# Patient Record
Sex: Female | Born: 1965 | Race: White | Hispanic: No | Marital: Married | Smoking: Never smoker
Health system: Southern US, Community
[De-identification: ages and names within clinical notes are randomized; demographics above are authoritative.]

## PROBLEM LIST (undated history)

## (undated) DIAGNOSIS — R519 Headache, unspecified: Secondary | ICD-10-CM

## (undated) DIAGNOSIS — F909 Attention-deficit hyperactivity disorder, unspecified type: Secondary | ICD-10-CM

## (undated) DIAGNOSIS — K219 Gastro-esophageal reflux disease without esophagitis: Secondary | ICD-10-CM

## (undated) DIAGNOSIS — F329 Major depressive disorder, single episode, unspecified: Secondary | ICD-10-CM

## (undated) DIAGNOSIS — R51 Headache: Secondary | ICD-10-CM

## (undated) DIAGNOSIS — I10 Essential (primary) hypertension: Secondary | ICD-10-CM

## (undated) DIAGNOSIS — Z8719 Personal history of other diseases of the digestive system: Secondary | ICD-10-CM

## (undated) DIAGNOSIS — F32A Depression, unspecified: Secondary | ICD-10-CM

## (undated) DIAGNOSIS — N200 Calculus of kidney: Secondary | ICD-10-CM

## (undated) DIAGNOSIS — D649 Anemia, unspecified: Secondary | ICD-10-CM

## (undated) DIAGNOSIS — Z87442 Personal history of urinary calculi: Secondary | ICD-10-CM

## (undated) DIAGNOSIS — G4730 Sleep apnea, unspecified: Secondary | ICD-10-CM

## (undated) HISTORY — DX: Gastro-esophageal reflux disease without esophagitis: K21.9

## (undated) HISTORY — DX: Essential (primary) hypertension: I10

## (undated) HISTORY — PX: HAND SURGERY: SHX662

---

## 1998-09-11 HISTORY — PX: ABDOMINAL HYSTERECTOMY: SHX81

## 2006-11-24 ENCOUNTER — Emergency Department: Payer: Self-pay | Admitting: Emergency Medicine

## 2006-12-29 ENCOUNTER — Emergency Department: Payer: Self-pay | Admitting: Emergency Medicine

## 2008-01-12 ENCOUNTER — Emergency Department: Payer: Self-pay | Admitting: Internal Medicine

## 2008-04-06 ENCOUNTER — Emergency Department: Payer: Self-pay | Admitting: Emergency Medicine

## 2008-09-24 ENCOUNTER — Emergency Department: Payer: Self-pay | Admitting: Emergency Medicine

## 2011-09-13 ENCOUNTER — Emergency Department: Payer: Self-pay | Admitting: Emergency Medicine

## 2013-05-19 ENCOUNTER — Emergency Department: Payer: Self-pay | Admitting: Emergency Medicine

## 2013-05-19 LAB — URINALYSIS, COMPLETE
Bilirubin,UR: NEGATIVE
Glucose,UR: NEGATIVE mg/dL (ref 0–75)
Ketone: NEGATIVE
Ph: 6 (ref 4.5–8.0)
RBC,UR: 129 /HPF (ref 0–5)
Specific Gravity: 1.02 (ref 1.003–1.030)
WBC UR: 376 /HPF (ref 0–5)

## 2013-11-07 ENCOUNTER — Emergency Department: Payer: Self-pay | Admitting: Emergency Medicine

## 2013-11-07 LAB — CBC
HCT: 38.6 % (ref 35.0–47.0)
HGB: 13.1 g/dL (ref 12.0–16.0)
MCH: 30.7 pg (ref 26.0–34.0)
MCHC: 34 g/dL (ref 32.0–36.0)
MCV: 90 fL (ref 80–100)
PLATELETS: 270 10*3/uL (ref 150–440)
RBC: 4.28 10*6/uL (ref 3.80–5.20)
RDW: 13 % (ref 11.5–14.5)
WBC: 4.3 10*3/uL (ref 3.6–11.0)

## 2013-11-07 LAB — COMPREHENSIVE METABOLIC PANEL
ALBUMIN: 4.3 g/dL (ref 3.4–5.0)
ALK PHOS: 79 U/L
ANION GAP: 3 — AB (ref 7–16)
BUN: 12 mg/dL (ref 7–18)
Bilirubin,Total: 0.3 mg/dL (ref 0.2–1.0)
CO2: 24 mmol/L (ref 21–32)
Calcium, Total: 9 mg/dL (ref 8.5–10.1)
Chloride: 112 mmol/L — ABNORMAL HIGH (ref 98–107)
Creatinine: 0.92 mg/dL (ref 0.60–1.30)
EGFR (Non-African Amer.): >60
Glucose: 87 mg/dL (ref 65–99)
OSMOLALITY: 277 (ref 275–301)
POTASSIUM: 3.5 mmol/L (ref 3.5–5.1)
SGOT(AST): 21 U/L (ref 15–37)
SGPT (ALT): 20 U/L (ref 12–78)
SODIUM: 139 mmol/L (ref 136–145)
Total Protein: 7.6 g/dL (ref 6.4–8.2)

## 2013-11-07 LAB — URINALYSIS, COMPLETE
BILIRUBIN, UR: NEGATIVE
BLOOD: NEGATIVE
Bacteria: NONE SEEN
GLUCOSE, UR: NEGATIVE mg/dL (ref 0–75)
Ketone: NEGATIVE
LEUKOCYTE ESTERASE: NEGATIVE
Nitrite: NEGATIVE
Ph: 6 (ref 4.5–8.0)
Protein: NEGATIVE
Specific Gravity: 1.01 (ref 1.003–1.030)
Transitional Epi: <1

## 2013-11-07 LAB — LIPASE, BLOOD: LIPASE: 137 U/L (ref 73–393)

## 2016-04-07 ENCOUNTER — Other Ambulatory Visit (HOSPITAL_COMMUNITY): Payer: Self-pay | Admitting: General Surgery

## 2016-04-19 ENCOUNTER — Encounter (HOSPITAL_COMMUNITY): Payer: Self-pay | Admitting: Radiology

## 2016-04-19 ENCOUNTER — Ambulatory Visit (HOSPITAL_COMMUNITY)
Admission: RE | Admit: 2016-04-19 | Discharge: 2016-04-19 | Disposition: A | Discharge status: Home / Self Care | Payer: BLUE CROSS/BLUE SHIELD | Source: Ambulatory Visit | Attending: General Surgery | Admitting: General Surgery

## 2016-04-19 ENCOUNTER — Other Ambulatory Visit: Payer: Self-pay

## 2016-04-19 DIAGNOSIS — K449 Diaphragmatic hernia without obstruction or gangrene: Secondary | ICD-10-CM | POA: Insufficient documentation

## 2016-05-01 ENCOUNTER — Encounter: Payer: Self-pay | Admitting: Skilled Nursing Facility1

## 2016-05-01 ENCOUNTER — Encounter: Payer: BLUE CROSS/BLUE SHIELD | Attending: General Surgery | Admitting: Skilled Nursing Facility1

## 2016-05-01 DIAGNOSIS — M545 Low back pain: Secondary | ICD-10-CM | POA: Insufficient documentation

## 2016-05-01 DIAGNOSIS — G43009 Migraine without aura, not intractable, without status migrainosus: Secondary | ICD-10-CM | POA: Insufficient documentation

## 2016-05-01 DIAGNOSIS — R0683 Snoring: Secondary | ICD-10-CM | POA: Insufficient documentation

## 2016-05-01 DIAGNOSIS — E669 Obesity, unspecified: Secondary | ICD-10-CM | POA: Insufficient documentation

## 2016-05-01 DIAGNOSIS — Z713 Dietary counseling and surveillance: Secondary | ICD-10-CM | POA: Insufficient documentation

## 2016-05-01 DIAGNOSIS — K219 Gastro-esophageal reflux disease without esophagitis: Secondary | ICD-10-CM | POA: Insufficient documentation

## 2016-05-01 NOTE — Progress Notes (Signed)
  Pre-Op Assessment Visit:  Pre-Operative Roux-En-Y Surgery  Medical Nutrition Therapy:  Appt start time: 0822  End time:  0900.  Patient was seen on 05/01/2016 for Pre-Operative Nutrition Assessment. Assessment and [facility] coordinator on 05/01/2016.   Preferred Learning Style:   No preference indicated   Learning Readiness:   Ready  Handouts given during visit include:  Pre-Op Goals Bariatric Surgery Protein Shakes  During the appointment today the following Pre-Op Goals were reviewed with the patient: Maintain or lose weight as instructed by your surgeon Make healthy food choices Begin to limit portion sizes Limited concentrated sugars and fried foods Keep fat/sugar in the single digits per serving on   food labels Practice CHEWING your food  (aim for 30 chews per bite or until applesauce consistency) Practice not drinking 15 minutes before, during, and 30 minutes after each meal/snack Avoid all carbonated beverages  Avoid/limit caffeinated beverages  Avoid all sugar-sweetened beverages Consume 3 meals per day; eat every 3-5 hours Make a list of non-food related activities Aim for 64-100 ounces of FLUID daily  Aim for at least 60-80 grams of PROTEIN daily Look for a liquid protein source that contain ?15 g protein and ?5 g carbohydrate  (ex: shakes, drinks, shots)  Patient-Centered Goals: 10/10 specific/non-scale and confidence/importance scale 1-10  Demonstrated degree of understanding via:  Teach Back  Teaching Method Utilized:  Visual Auditory Hands on  Barriers to learning/adherence to lifestyle change: none identified  Patient to call the [facility] to enroll in Pre-Op and Post-Op Nutrition Education when surgery date is scheduled.

## 2016-05-02 ENCOUNTER — Encounter: Payer: Self-pay | Admitting: Skilled Nursing Facility1

## 2016-05-22 ENCOUNTER — Ambulatory Visit (HOSPITAL_BASED_OUTPATIENT_CLINIC_OR_DEPARTMENT_OTHER): Payer: BLUE CROSS/BLUE SHIELD | Attending: General Surgery | Admitting: Internal Medicine

## 2016-05-22 VITALS — Ht 60.0 in | Wt 188.0 lb

## 2016-05-22 DIAGNOSIS — R0683 Snoring: Secondary | ICD-10-CM | POA: Insufficient documentation

## 2016-05-22 DIAGNOSIS — G4733 Obstructive sleep apnea (adult) (pediatric): Secondary | ICD-10-CM | POA: Insufficient documentation

## 2016-05-27 DIAGNOSIS — R0683 Snoring: Secondary | ICD-10-CM

## 2016-05-27 NOTE — Procedures (Signed)
    Patient Name: Kristin Copeland, Kristin Copeland Gender: Female D.O.B: 1966/08/23 Age (years): 50 Referring Provider: Gaynelle AduEric Wilson Height (inches): 60 Interpreting Physician: Jetty Duhamellinton Deadrian Toya MD, ABSM Weight (lbs): 188 RPSGT: Brainard SinkBarksdale, [place] BMI: 37 MRN: [id number] Neck Size: 14.50 CLINICAL INFORMATION Sleep Study Type: unattended HST   Indication for sleep study: Snoring   Epworth Sleepiness Score: 12 SLEEP STUDY TECHNIQUE A multi-channel overnight portable sleep study was performed. The channels recorded were: nasal airflow, thoracic respiratory movement, and oxygen saturation with a pulse oximetry. Snoring was also monitored.  MEDICATIONS Patient self administered medications include: none reported during study.  SLEEP ARCHITECTURE Patient was studied for 453.6 minutes. The sleep efficiency was 96.3 % and the patient was supine for 47.8%. The arousal index was 0.0 per hour.  RESPIRATORY PARAMETERS The overall AHI was 9.5 per hour, with a central apnea index of 0.3 per hour. The oxygen nadir was 82% during sleep.  CARDIAC DATA Mean heart rate during sleep was 70.0 bpm.  IMPRESSIONS - Mild obstructive sleep apnea occurred during this study (AHI = 9.5/h). - No significant central sleep apnea occurred during this study (CAI = 0.3/h). - Moderate oxygen desaturation was noted during this study (Min O2 = 82%). - Patient snored.  DIAGNOSIS - Obstructive Sleep Apnea (327.23 [G47.33 ICD-10])  RECOMMENDATIONS - Therapeutic CPAP titration to determine optimal pressure required to alleviate sleep disordered breathing. - Positional therapy avoiding supine position during sleep. - Surgical consultation for Uvulopalatopharyngoplasty (UPPP) may be considered. - Oral appliance may be considered. - Avoid alcohol, sedatives and other CNS depressants that may worsen sleep apnea and disrupt normal sleep architecture. - Sleep hygiene should be reviewed to assess factors that may  improve sleep quality. - Weight management and regular exercise should be initiated or continued.  [Electronically signed] 05/27/2016 09:35 AM  Jetty Duhamellinton Myshawn Chiriboga MD, ABSM Diplomate, American Board of Sleep Medicine   NPI: [id number]  Waymon BudgeYOUNG,Cauy Melody D Diplomate, American Board of Sleep Medicine  ELECTRONICALLY SIGNED ON:  05/27/2016, 9:35 AM [facility] PH: [phone]   FX: [phone] ACCREDITED BY THE AMERICAN ACADEMY OF SLEEP MEDICINE

## 2016-06-15 NOTE — Progress Notes (Signed)
[facility]      Assessment and Plan:  Obstructive sleep apnea. -Mild obstructive sleep apnea, discussed implications, as well as possible therapies. -We'll send the patient for titration, per insurance requirements. The patient will need to follow up within 1-3 months after initiation of CPAP.  Obesity.  -Patient is being planned for a Roux-en-Y gastric bypass  Date: 06/15/2016  MRN# [id number] Kristin Copeland 08/12/1966   Kristin Copeland is a 50 y.o. old female seen in consultation for chief complaint of:    Chief Complaint  Patient presents with  . sleep consult    Per Dr. Andrey CampanileWilson. last sleep study on 05-22-16. pt c/o daytime sleepiness, restless & gasping for air. EPWORTH:15    HPI:  The patient is a 50 year old female, with a history of ADD, depression, obesity. She is currently being evaluated for a  Roux-en-Y gastric bypass, she recently underwent a sleep study on 05/23/16 which showed mild obstructive sleep apnea.She typically goes to bed between 11 PM and 12 midnight. Usually takes 30 minutes to an hour to fall asleep, she wakes up 3-5 times per night. She typically gets up out of bed at 9 AM. She has gained approximately 20 pounds in the last 2 years. Her home medications include Wellbutrin, topiramate, propranolol, Ritalin.  She notes daytime sleepiness, she snores at night and has been told by husband that she stops breathing in her sleep. He sleeps with earplugs and headphones.   Home sleep Study Date: 05/23/2016 IMPRESSIONS - Mild obstructive sleep apnea occurred during this study (AHI = 9.5/h). - No significant central sleep apnea occurred during this study (CAI = 0.3/h). - Moderate oxygen desaturation was noted during this study (Min O2 = 82%). - Patient snored.   PMHX:   Past Medical History:  Diagnosis Date  . GERD (gastroesophageal reflux disease)   . Hypertension    Surgical Hx:  Past Surgical History:  Procedure Laterality Date    . ABDOMINAL HYSTERECTOMY    . HAND SURGERY     Family Hx:  Family History  Problem Relation Age of Onset  . Diabetes Other   . Hyperlipidemia Other   . Cancer Other   . Hypertension Other    Social Hx:   Social History  Substance Use Topics  . Smoking status: Not on file  . Smokeless tobacco: Not on file  . Alcohol use Not on file   Medication:     Reviewed.  Allergies:  Latex  Review of Systems: Gen:  Denies  fever, sweats, chills HEENT: Denies blurred vision, double vision. bleeds, sore throat Cvc:  No dizziness, chest pain. Resp:   Denies cough or sputum production, shortness of breath Gi: Denies swallowing difficulty, stomach pain. Endoc:  No polyuria, polydipsia. Psych: No depression, insomnia. Other:  All other systems were reviewed with the patient and were negative other that what is mentioned in the HPI.   Physical Examination:   VS: BP 128/86 (BP Location: Left Arm, Cuff Size: Normal)   Pulse 91   Ht 5' (1.524 m)   Wt 192 lb (87.1 kg)   SpO2 97%   BMI 37.50 kg/m   General Appearance: No distress  Neuro:without focal findings,  speech normal,  HEENT: PERRLA, EOM intact.   Pulmonary: normal breath sounds, No wheezing.  CardiovascularNormal S1,S2.  No m/r/g.   Abdomen: Benign, Soft, non-tender. Renal:  No costovertebral tenderness  GU:  No performed at this time. Endoc: No evident thyromegaly, no signs of  acromegaly. Skin:   warm, no rashes, no ecchymosis  Extremities: normal, no cyanosis, clubbing.  Other findings:    LABORATORY PANEL:   CBC No results for input(s): WBC, HGB, HCT, PLT in the last 168 hours. ------------------------------------------------------------------------------------------------------------------  Chemistries  No results for input(s): NA, K, CL, CO2, GLUCOSE, BUN, CREATININE, CALCIUM, MG, AST, ALT, ALKPHOS, BILITOT in the last 168 hours.  Invalid input(s):  GFRCGP ------------------------------------------------------------------------------------------------------------------  Cardiac Enzymes No results for input(s): TROPONINI in the last 168 hours. ------------------------------------------------------------  RADIOLOGY:  No results found.     Thank  you for the consultation and for allowing [facility] to assist in the care of your patient. Our recommendations are noted above.  Please contact us if we can be of further service.   Wells Guiles, MD.  Board Certified in Internal Medicine, Pulmonary Medicine, Critical Care Medicine, and Sleep Medicine.  [facility] Office Number: [phone]  Santiago Glad, M.D.  Stephanie Acre, M.D.  Billy Fischer, M.D  06/15/2016

## 2016-06-16 ENCOUNTER — Encounter: Payer: Self-pay | Admitting: Internal Medicine

## 2016-06-16 ENCOUNTER — Ambulatory Visit (INDEPENDENT_AMBULATORY_CARE_PROVIDER_SITE_OTHER): Payer: BLUE CROSS/BLUE SHIELD | Admitting: Internal Medicine

## 2016-06-16 VITALS — BP 128/86 | HR 91 | Ht 60.0 in | Wt 192.0 lb

## 2016-06-16 DIAGNOSIS — G4733 Obstructive sleep apnea (adult) (pediatric): Secondary | ICD-10-CM

## 2016-06-16 NOTE — Patient Instructions (Signed)
--  PAP titration as per insurance.   --Follow up 1-3 months after initiatino.

## 2016-07-06 ENCOUNTER — Telehealth: Payer: Self-pay | Admitting: Internal Medicine

## 2016-07-06 DIAGNOSIS — G4733 Obstructive sleep apnea (adult) (pediatric): Secondary | ICD-10-CM

## 2016-07-06 NOTE — Telephone Encounter (Signed)
Kristin Copeland with Sleep Med called and stated that pt's insurance denied the CPAP Titration Study. Pt has been informed.   Please send referral for Auto Pap if agreeable with physician. Rhonda J Cobb

## 2016-07-10 NOTE — Telephone Encounter (Signed)
Would you like to have an auto CPAP 5-20? Please advise since insurance denied in lab titration.

## 2016-07-14 NOTE — Telephone Encounter (Signed)
Order placed for auto CPAP. Nothing further needed.

## 2016-07-14 NOTE — Telephone Encounter (Signed)
yes

## 2016-07-24 ENCOUNTER — Encounter: Payer: BLUE CROSS/BLUE SHIELD | Attending: General Surgery | Admitting: Dietician

## 2016-07-24 DIAGNOSIS — E669 Obesity, unspecified: Secondary | ICD-10-CM | POA: Diagnosis not present

## 2016-07-24 DIAGNOSIS — Z713 Dietary counseling and surveillance: Secondary | ICD-10-CM | POA: Insufficient documentation

## 2016-07-24 DIAGNOSIS — G43009 Migraine without aura, not intractable, without status migrainosus: Secondary | ICD-10-CM | POA: Diagnosis not present

## 2016-07-24 DIAGNOSIS — K219 Gastro-esophageal reflux disease without esophagitis: Secondary | ICD-10-CM | POA: Insufficient documentation

## 2016-07-24 DIAGNOSIS — M545 Low back pain: Secondary | ICD-10-CM | POA: Insufficient documentation

## 2016-07-24 DIAGNOSIS — R0683 Snoring: Secondary | ICD-10-CM | POA: Insufficient documentation

## 2016-07-25 ENCOUNTER — Encounter: Payer: Self-pay | Admitting: Dietician

## 2016-07-25 ENCOUNTER — Institutional Professional Consult (permissible substitution): Payer: BLUE CROSS/BLUE SHIELD | Admitting: Pulmonary Disease

## 2016-07-25 NOTE — Progress Notes (Signed)
  Pre-Operative Nutrition Class:  Appt start time: 4782   End time:  1830.  Patient was seen on 07/24/2016 for Pre-Operative Bariatric Surgery Education at [facility].   Surgery date:  Surgery type: RYGB Start weight at [facility]: 189 lbs on 05/01/2016 Weight today: 194.4 lbs  TANITA  BODY COMP RESULTS  07/24/16   BMI (kg/m^2) 38   Fat Mass (lbs) 92   Fat Free Mass (lbs) 102.4   Total Body Water (lbs) 73.4   Samples given per MNT protocol. Patient educated on appropriate usage: Bariatric Advantage Multivitamin (mixed fruit - qty 1) Lot #: [id number] Exp: 07/2017  Premier protein shake (strawberry - qty 1) Lot #: [id number] Exp: 05/2017  Celebrate Vitamins Calcium Citrate (Cafe Mocha - qty 1) Lot #: [id number]  Exp: 12/2017  The following the learning objectives were met by the patient during this course:  Identify Pre-Op Dietary Goals and will begin 2 weeks pre-operatively  Identify appropriate sources of fluids and proteins   State protein recommendations and appropriate sources pre and post-operatively  Identify Post-Operative Dietary Goals and will follow for 2 weeks post-operatively  Identify appropriate multivitamin and calcium sources  Describe the need for physical activity post-operatively and will follow MD recommendations  State when to call healthcare provider regarding medication questions or post-operative complications  Handouts given during class include:  Pre-Op Bariatric Surgery Diet Handout  Protein Shake Handout  Post-Op Bariatric Surgery Nutrition Handout  BELT Program Information Flyer  Support Group Information Flyer  [facility] Bariatric Supplements Price List  Follow-Up Plan: Patient will follow-up at [facility] 2 weeks post operatively for diet advancement per MD.

## 2016-07-27 ENCOUNTER — Ambulatory Visit: Payer: Self-pay | Admitting: General Surgery

## 2016-07-27 NOTE — H&P (Signed)
Kristin Copeland 07/27/2016 4:30 PM Location: [facility] Patient #: [id number] DOB: 11-02-1965 Married / Language: Kristin Copeland / Race: White Female  History of Present Illness Kristin Copeland M. Kristin Nippert MD; 07/27/2016 6:55 PM) The patient is a 50 year old female who presents for a pre-op visit. She comes in today for preoperative appointment. Her chart has been submitted for insurance authorization for a laparoscopic Roux-en-Y gastric bypass with possible hiatal hernia repair and upper endoscopy. I initially met her in late July. At that time she was initially interested in sleeve gastrectomy but at the end of the visit she elected to proceed with gastric bypass. She underwent a sleep study which showed mild obstructive sleep apnea and she has been placed on CPAP therapy. Her upper GI also showed a small hiatal hernia. She does have daily reflux but is managed with medication. Evaluation lab work was unremarkable except for elevated lipid panel which showed a total cholesterol level of 247, triglyceride level of 225, LDL level of 147. She is accompanied by her husband today. She denies any other medical changes since she was last seen other than being diagnosed with hypertension and being placed on blood pressure medication. She denies any chest pain, chest pressure, shortness of breath, orthopnea, dyspnea on exertion, left upper arm pain or jaw pain or persistent nausea.  She has been evaluated by psychology and nutrition.   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) CHRONIC BILATERAL LOW BACK PAIN WITHOUT SCIATICA (M54.5) NONINTRACTABLE MIGRAINE, UNSPECIFIED MIGRAINE TYPE (G43.009) SNORING (R06.83) BENIGN HYPERTENSION (I10) GASTROESOPHAGEAL REFLUX DISEASE, ESOPHAGITIS PRESENCE NOT SPECIFIED (K21.9) OBESITY (BMI 30-39.9) (E66.9) MILD OBSTRUCTIVE SLEEP APNEA (G47.33)  Other Problems Gayland Curry, MD; 07/27/2016 6:55  PM) Migraine Headache Diverticulosis Depression Back Pain Anxiety Disorder  Past Surgical History Gayland Curry, MD; 07/27/2016 6:55 PM) Oral Surgery Hysterectomy (not due to cancer) - Partial  Diagnostic Studies History Gayland Curry, MD; 07/27/2016 6:55 PM) Colonoscopy never Pap Smear 1-5 years ago Mammogram >3 years ago  Allergies Gayland Curry, MD; 07/27/2016 6:55 PM) Latex No Known Drug Allergies 04/06/2016  Medication History Gayland Curry, MD; 07/27/2016 6:55 PM) Iron (50MG Tablet, Oral) Active. Multiple Vitamin (Oral) Active. Propranolol HCl ER (60MG Capsule ER 24HR, Oral) Active. Topiramate (25MG Tablet, Oral) Active. BuPROPion HCl ER (SR) (150MG Tablet ER 12HR, Oral) Active. Dicyclomine HCl (10MG Capsule, Oral) Active. Methylphenidate HCl ER (CD) (10MG Capsule ER, Oral) Active. Medications Reconciled Pantoprazole Sodium (40MG Tablet DR, 1 (one) Tablet Oral daily, Taken starting 07/27/2016) Active. Ondansetron (4MG Tablet Disint, 1 (one) Tablet Oral every six hours, as needed, Taken starting 07/27/2016) Active. OxyCODONE HCl (5MG/5ML Solution, 5-10 Milliliter Oral every four hours, as needed, Taken starting 07/27/2016) Active.  Social History Gayland Curry, MD; 07/27/2016 6:55 PM) Tobacco use Never smoker. No drug use No caffeine use Alcohol use Occasional alcohol use.  Family History Gayland Curry, MD; 07/27/2016 6:55 PM) Arthritis Mother. Depression Mother, Sister. Breast Cancer Mother. Thyroid problems Mother. Migraine Headache Mother. Hypertension Father, Mother. Heart Disease Father. Diabetes Mellitus Father, Sister. Heart disease in female family member before age 48 Heart disease in female family member before age 58  Pregnancy / Birth History Gayland Curry, MD; 07/27/2016 6:55 PM) Age at menarche 19 years. Length (months) of breastfeeding 7-12 Gravida 3 Maternal age 48-20 Para 3     Review of  Systems Kristin Copeland M. Desirey Keahey MD; 07/27/2016 6:03 PM) General Present- Fatigue and Night Sweats.  Not Present- Appetite Loss, Chills, Fever, Weight Gain and Weight Loss. Skin Not Present- Change in Wart/Mole, Dryness, Hives, Jaundice, New Lesions, Non-Healing Wounds, Rash and Ulcer. HEENT Present- Hoarseness and Ringing in the Ears. Not Present- Earache, Hearing Loss, Nose Bleed, Oral Ulcers, Seasonal Allergies, Sinus Pain, Sore Throat, Visual Disturbances, Wears glasses/contact lenses and Yellow Eyes. Respiratory Present- Snoring. Not Present- Bloody sputum, Chronic Cough, Difficulty Breathing and Wheezing. Cardiovascular Present- Leg Cramps and Swelling of Extremities. Not Present- Chest Pain, Difficulty Breathing Lying Down, Palpitations, Rapid Heart Rate and Shortness of Breath. Gastrointestinal Present- Abdominal Pain, Bloating, Constipation, Indigestion and Nausea. Not Present- Bloody Stool, Change in Bowel Habits, Chronic diarrhea, Difficulty Swallowing, Excessive gas, Gets full quickly at meals, Hemorrhoids, Rectal Pain and Vomiting. Musculoskeletal Present- Back Pain and Swelling of Extremities. Not Present- Joint Pain, Joint Stiffness, Muscle Pain and Muscle Weakness. Neurological Present- Headaches and Numbness. Not Present- Decreased Memory, Fainting, Seizures, Tingling, Tremor, Trouble walking and Weakness. Psychiatric Present- Anxiety and Depression. Not Present- Bipolar, Change in Sleep Pattern, Fearful and Frequent crying. Endocrine Present- Heat Intolerance and Hot flashes. Not Present- Cold Intolerance, Excessive Hunger, Hair Changes and New Diabetes. Hematology Present- Easy Bruising. Not Present- Blood Thinners, Excessive bleeding, Gland problems, HIV and Persistent Infections.  Vitals (Armen Glenn CMA; 07/27/2016 4:31 PM) 07/27/2016 4:30 PM Weight: 196 lb Height: 60in Body Surface Area: 1.85 m Body Mass Index: 38.28 kg/m  Temp.: 98.31F  Pulse: 83 (Regular)  P.OX: 97%  (Room air) BP: 140/88 (Sitting, Left Arm, Standard)      Physical Exam Kristin Copeland M. Wilson MD; 07/27/2016 6:03 PM)  General Mental Status-Alert. General Appearance-Consistent with stated age. Hydration-Well hydrated. Voice-Normal. Note: Central truncal obesity  Head and Neck Head-normocephalic, atraumatic with no lesions or palpable masses. Trachea-midline. Thyroid Gland Characteristics - normal size and consistency.  Eye Eyeball - Bilateral-Extraocular movements intact. Sclera/Conjunctiva - Bilateral-No scleral icterus.  Chest and Lung Exam Chest and lung exam reveals -quiet, even and easy respiratory effort with no use of accessory muscles and on auscultation, normal breath sounds, no adventitious sounds and normal vocal resonance. Inspection Chest Wall - Normal. Back - normal.  Breast - Did not examine.  Cardiovascular Cardiovascular examination reveals -normal heart sounds, regular rate and rhythm with no murmurs and normal pedal pulses bilaterally.  Abdomen Inspection Inspection of the abdomen reveals - No Hernias. Skin - Scar - no surgical scars. Palpation/Percussion Palpation and Percussion of the abdomen reveal - Soft, Non Tender, No Rebound tenderness, No Rigidity (guarding) and No hepatosplenomegaly. Auscultation Auscultation of the abdomen reveals - Bowel sounds normal.  Peripheral Vascular Upper Extremity Palpation - Pulses bilaterally normal.  Neurologic Neurologic evaluation reveals -alert and oriented x 3 with no impairment of recent or remote memory. Mental Status-Normal.  Neuropsychiatric The patient's mood and affect are described as -normal. Judgment and Insight-insight is appropriate concerning matters relevant to self.  Musculoskeletal Normal Exam - Left-Upper Extremity Strength Normal and Lower Extremity Strength Normal. Normal Exam - Right-Upper Extremity Strength Normal and Lower Extremity Strength  Normal.  Lymphatic Head & Neck  General Head & Neck Lymphatics: Bilateral - Description - Normal. Axillary - Did not examine. Femoral & Inguinal - Did not examine.    Assessment & Plan Kristin Copeland M. Wilson MD; 07/27/2016 6:52 PM)  OBESITY (BMI 30-39.9) (E66.9) Impression: The patient meets weight loss surgery criteria. I think the patient would be an acceptable candidate for Laparoscopic Roux-en-Y Gastric bypass.  We re discussed laparoscopic Roux-en-Y gastric bypass. We discussed the operative and postoperative  process. Using diagrams, I explained the surgery in detail including the performance of an EGD near the end of the surgery and an Upper GI swallow study on POD 1. We discussed the typical hospital course including a 2-3 day stay baring any complications. The patient was given educational material. I quoted the patient that they can expect to lose 50-70% of their excess weight with the gastric bypass. We did discuss the possibility of weight regain several years after the procedure.  We re discussed the risk and benefits of surgery including but not limited to anesthesia risk, bleeding, infection, anastomotic edema requiring a few additional days in the hospital, postop nausea, possible conversion to open procedure, blood clot formation, anastomotic leak, anastomotic stricture, ulcer formation, death, respiratory complications, intestinal blockage, internal hernia, gallstone formation, vitamin and nutritional deficiencies, hair loss, weight regain injury to surrounding structures, failure to lose weight and mood changes.  We re discussed that before and after surgery that there would be an alteration in their diet. I explained that we have put them on a diet 2 weeks before surgery. I also reexplained that they would be on a liquid diet for 2 weeks after surgery. We discussed that they would have to avoid certain foods such as sugar after surgery. We discussed the importance of physical activity  as well as compliance with our dietary and supplement recommendations and routine follow-up.  The patient was given her postoperative pain, heartburn and nausea prescriptions today. She is also given instructions on bowel prep  Current Plans Started OxyCODONE HCl 5MG/5ML, 5-10 Milliliter every four hours, as needed, 200 Milliliter, 07/27/2016, No Refill. Started Pantoprazole Sodium 40MG, 1 (one) Tablet daily, #30, 30 days starting 07/27/2016, Ref. x3. Started Ondansetron 4MG, 1 (one) Tablet every six hours, as needed, #20, 1 day starting 07/27/2016, No Refill. 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 Redmond Pulling, MD, FACS General, Bariatric, & Minimally Invasive Surgery [facility], [place]

## 2016-07-31 NOTE — Patient Instructions (Addendum)
Kristin Copeland  07/31/2016   Your procedure is scheduled on: Tuesday 08/08/2016  Report to [facility]  Entrance take [place]  elevators to 3rd floor to  Short Stay Center at   0730 AM.  Call this number if you have problems the morning of surgery [phone]   Remember: ONLY 1 PERSON MAY GO WITH YOU TO SHORT STAY TO GET  READY MORNING OF YOUR SURGERY.              FOLLOW BOWEL PREP INSTRUCTIONS FROM DR. Tawana Scale OFFICE WITH A CLEAR LIQUID DIET THE DAY BEFORE SURGERY ALL DAY !    CLEAR LIQUID DIET   Foods Allowed                                                                     Foods Excluded  Coffee and tea, regular and decaf                             liquids that you cannot  Plain Jell-O in any flavor                                             see through such as: Fruit ices (not with fruit pulp)                                     milk, soups, orange juice  Iced Popsicles                                    All solid food Carbonated beverages, regular and diet                                    Cranberry, grape and apple juices Sports drinks like Gatorade Lightly seasoned clear broth or consume(fat free) Sugar, honey syrup  Sample Menu Breakfast                                Lunch                                     Supper Cranberry juice                    Beef broth                            Chicken broth Jell-O  Grape juice                           Apple juice Coffee or tea                        Jell-O                                      Popsicle                                                Coffee or tea                        Coffee or tea  _____________________________________________________________________     Do not eat food or drink liquids :After Midnight.     Take these medicines the morning of surgery with A SIP OF WATER:  PROPANOLOL (INDERAL LA), TOPIRAMATE (TOPAMAX), BUPROPION  [facility](WELLBUTRIN SR)             If you receive your CPAP machine before surgery, please bring mask and tubing with you to the hospital!                                You may not have any metal on your body including hair pins and              piercings  Do not wear jewelry, make-up, lotions, powders or perfumes, deodorant             Do not wear nail polish.  Do not shave  48 hours prior to surgery.              Men may shave face and neck.   Do not bring valuables to the hospital. [facility] IS NOT             RESPONSIBLE   FOR VALUABLES.  Contacts, dentures or bridgework may not be worn into surgery.  Leave suitcase in the car. After surgery it may be brought to your room.                 Please read over the following fact sheets you were given: _____________________________________________________________________             [facility] Before surgery, you can play an important role.  Because skin is not sterile, your skin needs to be as free of germs as possible.  You can reduce the number of germs on your skin by washing with CHG (chlorahexidine gluconate) soap before surgery.  CHG is an antiseptic cleaner which kills germs and bonds with the skin to continue killing germs even after washing. Please DO NOT use if you have an allergy to CHG or antibacterial soaps.  If your skin becomes reddened/irritated stop using the CHG and inform your nurse when you arrive at Short Stay. Do not shave (including legs and underarms) for at least 48 hours prior to the first CHG shower.  You may shave your face/neck. Please follow these instructions carefully:  1.  Shower with CHG Soap the night before surgery and the  morning of Surgery.  2.  If you choose to wash your hair, wash your hair first as usual with your  normal  shampoo.  3.  After you shampoo, rinse your hair and body thoroughly to remove the  shampoo.                           4.  Use CHG as you would any other  liquid soap.  You can apply chg directly  to the skin and wash                       Gently with a scrungie or clean washcloth.  5.  Apply the CHG Soap to your body ONLY FROM THE NECK DOWN.   Do not use on face/ open                           Wound or open sores. Avoid contact with eyes, ears mouth and genitals (private parts).                       Wash face,  Genitals (private parts) with your normal soap.             6.  Wash thoroughly, paying special attention to the area where your surgery  will be performed.  7.  Thoroughly rinse your body with warm water from the neck down.  8.  DO NOT shower/wash with your normal soap after using and rinsing off  the CHG Soap.                9.  Pat yourself dry with a clean towel.            10.  Wear clean pajamas.            11.  Place clean sheets on your bed the night of your first shower and do not  sleep with pets. Day of Surgery : Do not apply any lotions/deodorants the morning of surgery.  Please wear clean clothes to the hospital/surgery center.  FAILURE TO FOLLOW THESE INSTRUCTIONS MAY RESULT IN THE CANCELLATION OF YOUR SURGERY PATIENT SIGNATURE_________________________________  NURSE SIGNATURE__________________________________  ________________________________________________________________________

## 2016-07-31 NOTE — Progress Notes (Signed)
04/19/2016- NOTED IN EPIC -EKG,CXR

## 2016-08-01 ENCOUNTER — Encounter (INDEPENDENT_AMBULATORY_CARE_PROVIDER_SITE_OTHER): Payer: Self-pay

## 2016-08-01 ENCOUNTER — Encounter (HOSPITAL_COMMUNITY)
Admission: RE | Admit: 2016-08-01 | Discharge: 2016-08-01 | Disposition: A | Discharge status: Home / Self Care | Payer: BLUE CROSS/BLUE SHIELD | Source: Ambulatory Visit | Attending: General Surgery | Admitting: General Surgery

## 2016-08-01 ENCOUNTER — Encounter (HOSPITAL_COMMUNITY): Payer: Self-pay

## 2016-08-01 DIAGNOSIS — Z01812 Encounter for preprocedural laboratory examination: Secondary | ICD-10-CM | POA: Insufficient documentation

## 2016-08-01 HISTORY — DX: Headache: R51

## 2016-08-01 HISTORY — DX: Major depressive disorder, single episode, unspecified: F32.9

## 2016-08-01 HISTORY — DX: Depression, unspecified: F32.A

## 2016-08-01 HISTORY — DX: Headache, unspecified: R51.9

## 2016-08-01 LAB — CBC WITH DIFFERENTIAL/PLATELET
BASOS ABS: 0 10*3/uL (ref 0.0–0.1)
BASOS PCT: 1 %
Eosinophils Absolute: 0.1 10*3/uL (ref 0.0–0.7)
Eosinophils Relative: 3 %
HEMATOCRIT: 37.2 % (ref 36.0–46.0)
HEMOGLOBIN: 12.6 g/dL (ref 12.0–15.0)
Lymphocytes Relative: 32 %
Lymphs Abs: 1.3 10*3/uL (ref 0.7–4.0)
MCH: 29.2 pg (ref 26.0–34.0)
MCHC: 33.9 g/dL (ref 30.0–36.0)
MCV: 86.1 fL (ref 78.0–100.0)
MONOS PCT: 8 %
Monocytes Absolute: 0.3 10*3/uL (ref 0.1–1.0)
NEUTROS ABS: 2.4 10*3/uL (ref 1.7–7.7)
NEUTROS PCT: 56 %
Platelets: 279 10*3/uL (ref 150–400)
RBC: 4.32 MIL/uL (ref 3.87–5.11)
RDW: 13 % (ref 11.5–15.5)
WBC: 4.1 10*3/uL (ref 4.0–10.5)

## 2016-08-01 LAB — COMPREHENSIVE METABOLIC PANEL
ALBUMIN: 4.4 g/dL (ref 3.5–5.0)
ALK PHOS: 78 U/L (ref 38–126)
ALT: 28 U/L (ref 14–54)
AST: 24 U/L (ref 15–41)
Anion gap: 6 (ref 5–15)
BILIRUBIN TOTAL: 0.6 mg/dL (ref 0.3–1.2)
BUN: 20 mg/dL (ref 6–20)
CALCIUM: 9.8 mg/dL (ref 8.9–10.3)
CO2: 25 mmol/L (ref 22–32)
Chloride: 107 mmol/L (ref 101–111)
Creatinine, Ser: 0.73 mg/dL (ref 0.44–1.00)
GFR calc Af Amer: >60 mL/min (ref >60)
GFR calc non Af Amer: >60 mL/min (ref >60)
GLUCOSE: 105 mg/dL — AB (ref 65–99)
Potassium: 4.2 mmol/L (ref 3.5–5.1)
Sodium: 138 mmol/L (ref 135–145)
TOTAL PROTEIN: 7.6 g/dL (ref 6.5–8.1)

## 2016-08-08 ENCOUNTER — Encounter (HOSPITAL_COMMUNITY): Payer: Self-pay | Admitting: *Deleted

## 2016-08-08 ENCOUNTER — Inpatient Hospital Stay (HOSPITAL_COMMUNITY)
Admission: RE | Admit: 2016-08-08 | Discharge: 2016-08-10 | DRG: 621 | Disposition: A | Discharge status: Home / Self Care | Payer: BLUE CROSS/BLUE SHIELD | Source: Ambulatory Visit | Attending: General Surgery | Admitting: General Surgery

## 2016-08-08 ENCOUNTER — Encounter (HOSPITAL_COMMUNITY)
Admission: RE | Disposition: A | Discharge status: Home / Self Care | Payer: Self-pay | Source: Ambulatory Visit | Attending: General Surgery

## 2016-08-08 ENCOUNTER — Inpatient Hospital Stay (HOSPITAL_COMMUNITY): Payer: BLUE CROSS/BLUE SHIELD | Admitting: Anesthesiology

## 2016-08-08 DIAGNOSIS — M545 Low back pain, unspecified: Secondary | ICD-10-CM | POA: Diagnosis present

## 2016-08-08 DIAGNOSIS — G4733 Obstructive sleep apnea (adult) (pediatric): Secondary | ICD-10-CM | POA: Diagnosis present

## 2016-08-08 DIAGNOSIS — K219 Gastro-esophageal reflux disease without esophagitis: Secondary | ICD-10-CM | POA: Diagnosis present

## 2016-08-08 DIAGNOSIS — G8929 Other chronic pain: Secondary | ICD-10-CM | POA: Diagnosis present

## 2016-08-08 DIAGNOSIS — Z9884 Bariatric surgery status: Secondary | ICD-10-CM

## 2016-08-08 DIAGNOSIS — K449 Diaphragmatic hernia without obstruction or gangrene: Secondary | ICD-10-CM

## 2016-08-08 DIAGNOSIS — Z79899 Other long term (current) drug therapy: Secondary | ICD-10-CM | POA: Diagnosis not present

## 2016-08-08 DIAGNOSIS — N393 Stress incontinence (female) (male): Secondary | ICD-10-CM | POA: Diagnosis present

## 2016-08-08 DIAGNOSIS — E7800 Pure hypercholesterolemia, unspecified: Secondary | ICD-10-CM | POA: Diagnosis present

## 2016-08-08 DIAGNOSIS — Z6838 Body mass index (BMI) 38.0-38.9, adult: Secondary | ICD-10-CM

## 2016-08-08 DIAGNOSIS — E669 Obesity, unspecified: Secondary | ICD-10-CM | POA: Diagnosis present

## 2016-08-08 DIAGNOSIS — Z9989 Dependence on other enabling machines and devices: Secondary | ICD-10-CM

## 2016-08-08 DIAGNOSIS — N39490 Overflow incontinence: Secondary | ICD-10-CM

## 2016-08-08 DIAGNOSIS — I10 Essential (primary) hypertension: Secondary | ICD-10-CM | POA: Diagnosis present

## 2016-08-08 HISTORY — PX: LAPAROSCOPIC ROUX-EN-Y GASTRIC BYPASS WITH HIATAL HERNIA REPAIR: SHX6513

## 2016-08-08 LAB — HEMOGLOBIN AND HEMATOCRIT, BLOOD
HCT: 37.7 % (ref 36.0–46.0)
Hemoglobin: 12.2 g/dL (ref 12.0–15.0)

## 2016-08-08 SURGERY — CREATION, GASTRIC BYPASS, LAPAROSCOPIC, USING ROUX-EN-Y GASTROENTEROSTOMY, WITH HIATAL HERNIA REPAIR
Anesthesia: General

## 2016-08-08 MED ORDER — MIDAZOLAM HCL 5 MG/5ML IJ SOLN
INTRAMUSCULAR | Status: DC | PRN
  Administered 2016-08-08: 2 mg via INTRAVENOUS

## 2016-08-08 MED ORDER — LIDOCAINE 2% (20 MG/ML) 5 ML SYRINGE
INTRAMUSCULAR | Status: AC
  Filled 2016-08-08: qty 5

## 2016-08-08 MED ORDER — LIDOCAINE HCL (CARDIAC) 20 MG/ML IV SOLN
INTRAVENOUS | Status: DC | PRN
  Administered 2016-08-08: 50 mg via INTRAVENOUS

## 2016-08-08 MED ORDER — EVICEL 5 ML EX KIT
PACK | Freq: Once | CUTANEOUS | Status: AC
Start: 2016-08-08 — End: 2016-08-08
  Administered 2016-08-08: 5 mL
  Filled 2016-08-08: qty 1

## 2016-08-08 MED ORDER — HYDROMORPHONE HCL 1 MG/ML IJ SOLN
0.2500 mg | INTRAMUSCULAR | Status: DC | PRN
  Administered 2016-08-08 (×4): 0.5 mg via INTRAVENOUS

## 2016-08-08 MED ORDER — ONDANSETRON HCL 4 MG/2ML IJ SOLN
INTRAMUSCULAR | Status: AC
  Filled 2016-08-08: qty 2

## 2016-08-08 MED ORDER — ACETAMINOPHEN 10 MG/ML IV SOLN
INTRAVENOUS | Status: AC
  Filled 2016-08-08: qty 100

## 2016-08-08 MED ORDER — ROCURONIUM BROMIDE 50 MG/5ML IV SOSY
PREFILLED_SYRINGE | INTRAVENOUS | Status: AC
  Filled 2016-08-08: qty 5

## 2016-08-08 MED ORDER — SODIUM CHLORIDE 0.9 % IJ SOLN
INTRAMUSCULAR | Status: DC | PRN
  Administered 2016-08-08: 50 mL

## 2016-08-08 MED ORDER — FENTANYL CITRATE (PF) 250 MCG/5ML IJ SOLN
INTRAMUSCULAR | Status: AC
  Filled 2016-08-08: qty 5

## 2016-08-08 MED ORDER — EPHEDRINE SULFATE 50 MG/ML IJ SOLN
INTRAMUSCULAR | Status: DC | PRN
  Administered 2016-08-08 (×4): 5 mg via INTRAVENOUS

## 2016-08-08 MED ORDER — DIPHENHYDRAMINE HCL 50 MG/ML IJ SOLN: 12.5000 mg | Freq: Three times a day (TID) | INTRAMUSCULAR | Status: DC | PRN

## 2016-08-08 MED ORDER — CEFOTETAN DISODIUM-DEXTROSE 2-2.08 GM-% IV SOLR
2.0000 g | INTRAVENOUS | Status: AC
  Administered 2016-08-08: 2 g via INTRAVENOUS

## 2016-08-08 MED ORDER — KCL IN DEXTROSE-NACL 20-5-0.45 MEQ/L-%-% IV SOLN
INTRAVENOUS | Status: DC
  Administered 2016-08-08 – 2016-08-09 (×3): via INTRAVENOUS
  Filled 2016-08-08 (×5): qty 1000

## 2016-08-08 MED ORDER — PROPOFOL 10 MG/ML IV BOLUS
INTRAVENOUS | Status: AC
  Filled 2016-08-08: qty 20

## 2016-08-08 MED ORDER — PROMETHAZINE HCL 25 MG/ML IJ SOLN
INTRAMUSCULAR | Status: AC
  Filled 2016-08-08: qty 1

## 2016-08-08 MED ORDER — ROCURONIUM BROMIDE 100 MG/10ML IV SOLN
INTRAVENOUS | Status: DC | PRN
  Administered 2016-08-08: 20 mg via INTRAVENOUS
  Administered 2016-08-08: 50 mg via INTRAVENOUS
  Administered 2016-08-08: 10 mg via INTRAVENOUS

## 2016-08-08 MED ORDER — PROMETHAZINE HCL 25 MG/ML IJ SOLN: 12.5000 mg | Freq: Four times a day (QID) | INTRAMUSCULAR | Status: DC | PRN

## 2016-08-08 MED ORDER — OXYCODONE HCL 5 MG/5ML PO SOLN
5.0000 mg | ORAL | Status: DC | PRN
  Administered 2016-08-09 (×2): 5 mg via ORAL
  Administered 2016-08-10 (×3): 10 mg via ORAL
  Administered 2016-08-10: 5 mg via ORAL
  Filled 2016-08-08: qty 5
  Filled 2016-08-08: qty 10
  Filled 2016-08-08 (×2): qty 5
  Filled 2016-08-08 (×2): qty 10
  Filled 2016-08-08: qty 5

## 2016-08-08 MED ORDER — ACETAMINOPHEN 10 MG/ML IV SOLN
1000.0000 mg | Freq: Four times a day (QID) | INTRAVENOUS | Status: AC
  Administered 2016-08-08 – 2016-08-09 (×4): 1000 mg via INTRAVENOUS
  Filled 2016-08-08 (×4): qty 100

## 2016-08-08 MED ORDER — CEFOTETAN DISODIUM-DEXTROSE 2-2.08 GM-% IV SOLR
INTRAVENOUS | Status: AC
  Filled 2016-08-08: qty 50

## 2016-08-08 MED ORDER — PROPOFOL 10 MG/ML IV BOLUS
INTRAVENOUS | Status: DC | PRN
  Administered 2016-08-08: 200 mg via INTRAVENOUS

## 2016-08-08 MED ORDER — CHLORHEXIDINE GLUCONATE CLOTH 2 % EX PADS: 6.0000 | MEDICATED_PAD | Freq: Once | CUTANEOUS | Status: DC

## 2016-08-08 MED ORDER — PREMIER PROTEIN SHAKE
2.0000 [oz_av] | ORAL | Status: DC
  Administered 2016-08-10 (×5): 2 [oz_av] via ORAL

## 2016-08-08 MED ORDER — ONDANSETRON HCL 4 MG/2ML IJ SOLN
INTRAMUSCULAR | Status: DC | PRN
Start: 2016-08-08 — End: 2016-08-08
  Administered 2016-08-08: 4 mg via INTRAVENOUS

## 2016-08-08 MED ORDER — LACTATED RINGERS IV SOLN
INTRAVENOUS | Status: DC
  Administered 2016-08-08 (×2): via INTRAVENOUS

## 2016-08-08 MED ORDER — FENTANYL CITRATE (PF) 100 MCG/2ML IJ SOLN
INTRAMUSCULAR | Status: DC | PRN
  Administered 2016-08-08 (×2): 50 ug via INTRAVENOUS
  Administered 2016-08-08: 100 ug via INTRAVENOUS

## 2016-08-08 MED ORDER — HYDROMORPHONE HCL 1 MG/ML IJ SOLN
INTRAMUSCULAR | Status: AC
  Filled 2016-08-08: qty 1

## 2016-08-08 MED ORDER — MIDAZOLAM HCL 2 MG/2ML IJ SOLN
INTRAMUSCULAR | Status: AC
  Filled 2016-08-08: qty 2

## 2016-08-08 MED ORDER — MORPHINE SULFATE (PF) 2 MG/ML IV SOLN
2.0000 mg | INTRAVENOUS | Status: DC | PRN
  Administered 2016-08-08 – 2016-08-09 (×4): 2 mg via INTRAVENOUS
  Filled 2016-08-08 (×4): qty 1

## 2016-08-08 MED ORDER — SUGAMMADEX SODIUM 200 MG/2ML IV SOLN
INTRAVENOUS | Status: AC
  Filled 2016-08-08: qty 2

## 2016-08-08 MED ORDER — BUPIVACAINE LIPOSOME 1.3 % IJ SUSP
20.0000 mL | Freq: Once | INTRAMUSCULAR | Status: AC
  Administered 2016-08-08: 20 mL
  Filled 2016-08-08: qty 20

## 2016-08-08 MED ORDER — METOPROLOL TARTRATE 5 MG/5ML IV SOLN
5.0000 mg | Freq: Four times a day (QID) | INTRAVENOUS | Status: DC
  Administered 2016-08-09 (×4): 5 mg via INTRAVENOUS
  Filled 2016-08-08 (×4): qty 5

## 2016-08-08 MED ORDER — PANTOPRAZOLE SODIUM 40 MG IV SOLR
40.0000 mg | Freq: Every day | INTRAVENOUS | Status: DC
  Administered 2016-08-08 – 2016-08-09 (×2): 40 mg via INTRAVENOUS
  Filled 2016-08-08 (×2): qty 40

## 2016-08-08 MED ORDER — DEXAMETHASONE SODIUM PHOSPHATE 10 MG/ML IJ SOLN
INTRAMUSCULAR | Status: AC
  Filled 2016-08-08: qty 1

## 2016-08-08 MED ORDER — ACETAMINOPHEN 160 MG/5ML PO SOLN
650.0000 mg | ORAL | Status: DC | PRN
  Administered 2016-08-09 – 2016-08-10 (×5): 650 mg via ORAL
  Filled 2016-08-08 (×5): qty 20.3

## 2016-08-08 MED ORDER — EPHEDRINE 5 MG/ML INJ
INTRAVENOUS | Status: AC
  Filled 2016-08-08: qty 10

## 2016-08-08 MED ORDER — SUGAMMADEX SODIUM 200 MG/2ML IV SOLN
INTRAVENOUS | Status: DC | PRN
  Administered 2016-08-08: 200 mg via INTRAVENOUS

## 2016-08-08 MED ORDER — ENOXAPARIN SODIUM 30 MG/0.3ML ~~LOC~~ SOLN
30.0000 mg | Freq: Two times a day (BID) | SUBCUTANEOUS | Status: DC
  Administered 2016-08-09 – 2016-08-10 (×3): 30 mg via SUBCUTANEOUS
  Filled 2016-08-08 (×3): qty 0.3

## 2016-08-08 MED ORDER — EVICEL 5 ML EX KIT
PACK | Freq: Once | CUTANEOUS | Status: AC
  Administered 2016-08-08: 1
  Filled 2016-08-08: qty 1

## 2016-08-08 MED ORDER — PROMETHAZINE HCL 25 MG/ML IJ SOLN: 6.2500 mg | Freq: Once | INTRAMUSCULAR | Status: DC

## 2016-08-08 MED ORDER — DEXAMETHASONE SODIUM PHOSPHATE 10 MG/ML IJ SOLN
INTRAMUSCULAR | Status: DC | PRN
  Administered 2016-08-08: 10 mg via INTRAVENOUS

## 2016-08-08 MED ORDER — STERILE WATER FOR IRRIGATION IR SOLN
Status: DC | PRN
  Administered 2016-08-08: 1000 mL

## 2016-08-08 MED ORDER — ACETAMINOPHEN 160 MG/5ML PO SOLN
325.0000 mg | ORAL | Status: DC | PRN
Start: 2016-08-09 — End: 2016-08-10

## 2016-08-08 MED ORDER — LACTATED RINGERS IR SOLN
Status: DC | PRN
  Administered 2016-08-08: 3000 mL

## 2016-08-08 MED ORDER — 0.9 % SODIUM CHLORIDE (POUR BTL) OPTIME
TOPICAL | Status: DC | PRN
  Administered 2016-08-08: 1000 mL

## 2016-08-08 MED ORDER — ACETAMINOPHEN 10 MG/ML IV SOLN
1000.0000 mg | Freq: Once | INTRAVENOUS | Status: AC
  Administered 2016-08-08: 1000 mg via INTRAVENOUS

## 2016-08-08 MED ORDER — HEPARIN SODIUM (PORCINE) 5000 UNIT/ML IJ SOLN
5000.0000 [IU] | INTRAMUSCULAR | Status: AC
  Administered 2016-08-08: 5000 [IU] via SUBCUTANEOUS
  Filled 2016-08-08: qty 1

## 2016-08-08 MED ORDER — ONDANSETRON HCL 4 MG/2ML IJ SOLN
4.0000 mg | INTRAMUSCULAR | Status: DC | PRN
  Administered 2016-08-09: 4 mg via INTRAVENOUS
  Filled 2016-08-08: qty 2

## 2016-08-08 SURGICAL SUPPLY — 68 items
APPLICATOR COTTON TIP 6IN STRL (MISCELLANEOUS) ×4 IMPLANT
APPLIER CLIP ROT 13.4 12 LRG (CLIP)
BLADE SURG SZ11 CARB STEEL (BLADE) ×2 IMPLANT
CABLE HIGH FREQUENCY MONO STRZ (ELECTRODE) IMPLANT
CHLORAPREP W/TINT 26ML (MISCELLANEOUS) ×4 IMPLANT
CLIP APPLIE ROT 13.4 12 LRG (CLIP) IMPLANT
CLIP SUT LAPRA TY ABSORB (SUTURE) ×6 IMPLANT
COVER SURGICAL LIGHT HANDLE (MISCELLANEOUS) ×2 IMPLANT
CUTTER FLEX LINEAR 45M (STAPLE) IMPLANT
DERMABOND ADVANCED (GAUZE/BANDAGES/DRESSINGS)
DERMABOND ADVANCED .7 DNX12 (GAUZE/BANDAGES/DRESSINGS) IMPLANT
DEVICE SUT QUICK LOAD TK 5 (STAPLE) ×2 IMPLANT
DEVICE SUT TI-KNOT TK 5X26 (MISCELLANEOUS) ×2 IMPLANT
DEVICE SUTURE ENDOST 10MM (ENDOMECHANICALS) ×2 IMPLANT
DRAIN PENROSE 18X1/4 LTX STRL (WOUND CARE) ×2 IMPLANT
ELECT REM PT RETURN 9FT ADLT (ELECTROSURGICAL) ×2
ELECTRODE REM PT RTRN 9FT ADLT (ELECTROSURGICAL) ×1 IMPLANT
GAUZE SPONGE 4X4 12PLY STRL (GAUZE/BANDAGES/DRESSINGS) IMPLANT
GAUZE SPONGE 4X4 16PLY XRAY LF (GAUZE/BANDAGES/DRESSINGS) ×2 IMPLANT
GLOVE BIO SURGEON STRL SZ7.5 (GLOVE) IMPLANT
GLOVE INDICATOR 8.0 STRL GRN (GLOVE) ×2 IMPLANT
GOWN STRL REUS W/TWL XL LVL3 (GOWN DISPOSABLE) ×8 IMPLANT
HOVERMATT SINGLE USE (MISCELLANEOUS) IMPLANT
IRRIG SUCT STRYKERFLOW 2 WTIP (MISCELLANEOUS) ×2
IRRIGATION SUCT STRKRFLW 2 WTP (MISCELLANEOUS) ×1 IMPLANT
KIT BASIN OR (CUSTOM PROCEDURE TRAY) ×2 IMPLANT
KIT GASTRIC LAVAGE 34FR ADT (SET/KITS/TRAYS/PACK) ×2 IMPLANT
LUBRICANT JELLY K Y 4OZ (MISCELLANEOUS) ×2 IMPLANT
MARKER SKIN DUAL TIP RULER LAB (MISCELLANEOUS) ×2 IMPLANT
NEEDLE SPNL 22GX3.5 QUINCKE BK (NEEDLE) ×2 IMPLANT
PACK CARDIOVASCULAR III (CUSTOM PROCEDURE TRAY) ×2 IMPLANT
RELOAD 45 VASCULAR/THIN (ENDOMECHANICALS) IMPLANT
RELOAD ENDO STITCH 2.0 (ENDOMECHANICALS) ×11
RELOAD STAPLE TA45 3.5 REG BLU (ENDOMECHANICALS) IMPLANT
RELOAD STAPLER BLUE 60MM (STAPLE) ×3 IMPLANT
RELOAD STAPLER GOLD 60MM (STAPLE) ×1 IMPLANT
RELOAD STAPLER WHITE 60MM (STAPLE) ×2 IMPLANT
SCISSORS LAP 5X45 EPIX DISP (ENDOMECHANICALS) ×2 IMPLANT
SHEARS HARMONIC ACE PLUS 45CM (MISCELLANEOUS) ×2 IMPLANT
SLEEVE ADV FIXATION 12X100MM (TROCAR) ×4 IMPLANT
SLEEVE XCEL OPT CAN 5 100 (ENDOMECHANICALS) IMPLANT
SOLUTION ANTI FOG 6CC (MISCELLANEOUS) ×2 IMPLANT
STAPLER ECHELON BIOABSB 60 FLE (MISCELLANEOUS) IMPLANT
STAPLER ECHELON LONG 60 440 (INSTRUMENTS) ×2 IMPLANT
STAPLER RELOAD BLUE 60MM (STAPLE) ×6
STAPLER RELOAD GOLD 60MM (STAPLE) ×2
STAPLER RELOAD WHITE 60MM (STAPLE) ×4
SUT MNCRL AB 4-0 PS2 18 (SUTURE) ×2 IMPLANT
SUT RELOAD ENDO STITCH 2 48X1 (ENDOMECHANICALS) ×5
SUT RELOAD ENDO STITCH 2.0 (ENDOMECHANICALS) ×6
SUT SURGIDAC NAB ES-9 0 48 120 (SUTURE) ×2 IMPLANT
SUT VIC AB 2-0 SH 27 (SUTURE) ×1
SUT VIC AB 2-0 SH 27X BRD (SUTURE) ×1 IMPLANT
SUTURE RELOAD END STTCH 2 48X1 (ENDOMECHANICALS) ×5 IMPLANT
SUTURE RELOAD ENDO STITCH 2.0 (ENDOMECHANICALS) ×6 IMPLANT
SYR 10ML ECCENTRIC (SYRINGE) ×2 IMPLANT
SYR 20CC LL (SYRINGE) ×4 IMPLANT
TIP RIGID 35CM EVICEL (HEMOSTASIS) ×2 IMPLANT
TOWEL OR 17X26 10 PK STRL BLUE (TOWEL DISPOSABLE) ×2 IMPLANT
TOWEL OR NON WOVEN STRL DISP B (DISPOSABLE) ×2 IMPLANT
TRAY FOLEY W/METER SILVER 16FR (SET/KITS/TRAYS/PACK) ×2 IMPLANT
TROCAR ADV FIXATION 12X100MM (TROCAR) ×2 IMPLANT
TROCAR ADV FIXATION 5X100MM (TROCAR) ×2 IMPLANT
TROCAR BLADELESS OPT 5 100 (ENDOMECHANICALS) ×2 IMPLANT
TROCAR XCEL 12X100 BLDLESS (ENDOMECHANICALS) ×2 IMPLANT
TUBING CONNECTING 10 (TUBING) IMPLANT
TUBING ENDO SMARTCAP PENTAX (MISCELLANEOUS) ×2 IMPLANT
TUBING INSUF HEATED (TUBING) ×2 IMPLANT

## 2016-08-08 NOTE — Progress Notes (Signed)
Received call from Dr. Andrey CampanileWilson stating patient may start water and ice chips tonight.

## 2016-08-08 NOTE — H&P (View-Only) (Signed)
Kristin Copeland 07/27/2016 4:30 PM Location: [facility] Patient #: [id number] DOB: 11-02-1965 Married / Language: Kristin Copeland M[place] / Race: White Female  History of Present Illness Kristin Hiss M. Avi Archuleta Copeland; 07/27/2016 6:55 PM) The patient is a 50 year old female who presents for a pre-op visit. She comes in today for preoperative appointment. Her chart has been submitted for insurance authorization for a laparoscopic Roux-en-Y gastric bypass with possible hiatal hernia repair and upper endoscopy. I initially met her in late July. At that time she was initially interested in sleeve gastrectomy but at the end of the visit she elected to proceed with gastric bypass. She underwent a sleep study which showed mild obstructive sleep apnea and she has been placed on CPAP therapy. Her upper GI also showed a small hiatal hernia. She does have daily reflux but is managed with medication. Evaluation lab work was unremarkable except for elevated lipid panel which showed a total cholesterol level of 247, triglyceride level of 225, LDL level of 147. She is accompanied by her husband today. She denies any other medical changes since she was last seen other than being diagnosed with hypertension and being placed on blood pressure medication. She denies any chest pain, chest pressure, shortness of breath, orthopnea, dyspnea on exertion, left upper arm pain or jaw pain or persistent nausea.  She has been evaluated by psychology and nutrition.   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) CHRONIC BILATERAL LOW BACK PAIN WITHOUT SCIATICA (M54.5) NONINTRACTABLE MIGRAINE, UNSPECIFIED MIGRAINE TYPE (G43.009) SNORING (R06.83) BENIGN HYPERTENSION (I10) GASTROESOPHAGEAL REFLUX DISEASE, ESOPHAGITIS PRESENCE NOT SPECIFIED (K21.9) OBESITY (BMI 30-39.9) (E66.9) MILD OBSTRUCTIVE SLEEP APNEA (G47.33)  Other Problems Kristin Copeland; 07/27/2016 6:55  PM) Migraine Headache Diverticulosis Depression Back Pain Anxiety Disorder  Past Surgical History Kristin Copeland; 07/27/2016 6:55 PM) Oral Surgery Hysterectomy (not due to cancer) - Partial  Diagnostic Studies History Kristin Copeland; 07/27/2016 6:55 PM) Colonoscopy never Pap Smear 1-5 years ago Mammogram >3 years ago  Allergies Kristin Copeland; 07/27/2016 6:55 PM) Latex No Known Drug Allergies 04/06/2016  Medication History Kristin Copeland; 07/27/2016 6:55 PM) Iron (50MG Tablet, Oral) Active. Multiple Vitamin (Oral) Active. Propranolol HCl ER (60MG Capsule ER 24HR, Oral) Active. Topiramate (25MG Tablet, Oral) Active. BuPROPion HCl ER (SR) (150MG Tablet ER 12HR, Oral) Active. Dicyclomine HCl (10MG Capsule, Oral) Active. Methylphenidate HCl ER (CD) (10MG Capsule ER, Oral) Active. Medications Reconciled Pantoprazole Sodium (40MG Tablet DR, 1 (one) Tablet Oral daily, Taken starting 07/27/2016) Active. Ondansetron (4MG Tablet Disint, 1 (one) Tablet Oral every six hours, as needed, Taken starting 07/27/2016) Active. OxyCODONE HCl (5MG/5ML Solution, 5-10 Milliliter Oral every four hours, as needed, Taken starting 07/27/2016) Active.  Social History Kristin Copeland; 07/27/2016 6:55 PM) Tobacco use Never smoker. No drug use No caffeine use Alcohol use Occasional alcohol use.  Family History Kristin Copeland; 07/27/2016 6:55 PM) Arthritis Mother. Depression Mother, Sister. Breast Cancer Mother. Thyroid problems Mother. Migraine Headache Mother. Hypertension Father, Mother. Heart Disease Father. Diabetes Mellitus Father, Sister. Heart disease in female family member before age 48 Heart disease in female family member before age 58  Pregnancy / Birth History Kristin Copeland; 07/27/2016 6:55 PM) Age at menarche 19 years. Length (months) of breastfeeding 7-12 Gravida 3 Maternal age 48-20 Para 3     Review of  Systems Kristin Hiss M. Josetta Wigal Copeland; 07/27/2016 6:03 PM) General Present- Fatigue and Night Sweats.  Not Present- Appetite Loss, Chills, Fever, Weight Gain and Weight Loss. Skin Not Present- Change in Wart/Mole, Dryness, Hives, Jaundice, New Lesions, Non-Healing Wounds, Rash and Ulcer. HEENT Present- Hoarseness and Ringing in the Ears. Not Present- Earache, Hearing Loss, Nose Bleed, Oral Ulcers, Seasonal Allergies, Sinus Pain, Sore Throat, Visual Disturbances, Wears glasses/contact lenses and Yellow Eyes. Respiratory Present- Snoring. Not Present- Bloody sputum, Chronic Cough, Difficulty Breathing and Wheezing. Cardiovascular Present- Leg Cramps and Swelling of Extremities. Not Present- Chest Pain, Difficulty Breathing Lying Down, Palpitations, Rapid Heart Rate and Shortness of Breath. Gastrointestinal Present- Abdominal Pain, Bloating, Constipation, Indigestion and Nausea. Not Present- Bloody Stool, Change in Bowel Habits, Chronic diarrhea, Difficulty Swallowing, Excessive gas, Gets full quickly at meals, Hemorrhoids, Rectal Pain and Vomiting. Musculoskeletal Present- Back Pain and Swelling of Extremities. Not Present- Joint Pain, Joint Stiffness, Muscle Pain and Muscle Weakness. Neurological Present- Headaches and Numbness. Not Present- Decreased Memory, Fainting, Seizures, Tingling, Tremor, Trouble walking and Weakness. Psychiatric Present- Anxiety and Depression. Not Present- Bipolar, Change in Sleep Pattern, Fearful and Frequent crying. Endocrine Present- Heat Intolerance and Hot flashes. Not Present- Cold Intolerance, Excessive Hunger, Hair Changes and New Diabetes. Hematology Present- Easy Bruising. Not Present- Blood Thinners, Excessive bleeding, Gland problems, HIV and Persistent Infections.  Vitals (Kristin Copeland; 07/27/2016 4:31 PM) 07/27/2016 4:30 PM Weight: 196 lb Height: 60in Body Surface Area: 1.85 m Body Mass Index: 38.28 kg/m  Temp.: 98.2F  Pulse: 83 (Regular)  P.OX: 97%  (Room air) BP: 140/88 (Sitting, Left Arm, Standard)      Physical Exam (Michelle Vanhise M. Emeric Novinger Copeland; 07/27/2016 6:03 PM)  General Mental Status-Alert. General Appearance-Consistent with stated age. Hydration-Well hydrated. Voice-Normal. Note: Central truncal obesity  Head and Neck Head-normocephalic, atraumatic with no lesions or palpable masses. Trachea-midline. Thyroid Gland Characteristics - normal size and consistency.  Eye Eyeball - Bilateral-Extraocular movements intact. Sclera/Conjunctiva - Bilateral-No scleral icterus.  Chest and Lung Exam Chest and lung exam reveals -quiet, even and easy respiratory effort with no use of accessory muscles and on auscultation, normal breath sounds, no adventitious sounds and normal vocal resonance. Inspection Chest Wall - Normal. Back - normal.  Breast - Did not examine.  Cardiovascular Cardiovascular examination reveals -normal heart sounds, regular rate and rhythm with no murmurs and normal pedal pulses bilaterally.  Abdomen Inspection Inspection of the abdomen reveals - No Hernias. Skin - Scar - no surgical scars. Palpation/Percussion Palpation and Percussion of the abdomen reveal - Soft, Non Tender, No Rebound tenderness, No Rigidity (guarding) and No hepatosplenomegaly. Auscultation Auscultation of the abdomen reveals - Bowel sounds normal.  Peripheral Vascular Upper Extremity Palpation - Pulses bilaterally normal.  Neurologic Neurologic evaluation reveals -alert and oriented x 3 with no impairment of recent or remote memory. Mental Status-Normal.  Neuropsychiatric The patient's mood and affect are described as -normal. Judgment and Insight-insight is appropriate concerning matters relevant to self.  Musculoskeletal Normal Exam - Left-Upper Extremity Strength Normal and Lower Extremity Strength Normal. Normal Exam - Right-Upper Extremity Strength Normal and Lower Extremity Strength  Normal.  Lymphatic Head & Neck  General Head & Neck Lymphatics: Bilateral - Description - Normal. Axillary - Did not examine. Femoral & Inguinal - Did not examine.    Assessment & Plan (Margarethe Virgen M. Kenner Lewan Copeland; 07/27/2016 6:52 PM)  OBESITY (BMI 30-39.9) (E66.9) Impression: The patient meets weight loss surgery criteria. I think the patient would be an acceptable candidate for Laparoscopic Roux-en-Y Gastric bypass.  We re discussed laparoscopic Roux-en-Y gastric bypass. We discussed the operative and postoperative   process. Using diagrams, I explained the surgery in detail including the performance of an EGD near the end of the surgery and an Upper GI swallow study on POD 1. We discussed the typical hospital course including a 2-3 day stay baring any complications. The patient was given educational material. I quoted the patient that they can expect to lose 50-70% of their excess weight with the gastric bypass. We did discuss the possibility of weight regain several years after the procedure.  We re discussed the risk and benefits of surgery including but not limited to anesthesia risk, bleeding, infection, anastomotic edema requiring a few additional days in the hospital, postop nausea, possible conversion to open procedure, blood clot formation, anastomotic leak, anastomotic stricture, ulcer formation, death, respiratory complications, intestinal blockage, internal hernia, gallstone formation, vitamin and nutritional deficiencies, hair loss, weight regain injury to surrounding structures, failure to lose weight and mood changes.  We re discussed that before and after surgery that there would be an alteration in their diet. I explained that we have put them on a diet 2 weeks before surgery. I also reexplained that they would be on a liquid diet for 2 weeks after surgery. We discussed that they would have to avoid certain foods such as sugar after surgery. We discussed the importance of physical activity  as well as compliance with our dietary and supplement recommendations and routine follow-up.  The patient was given her postoperative pain, heartburn and nausea prescriptions today. She is also given instructions on bowel prep  Current Plans Started OxyCODONE HCl 5MG/5ML, 5-10 Milliliter every four hours, as needed, 200 Milliliter, 07/27/2016, No Refill. Started Pantoprazole Sodium 40MG, 1 (one) Tablet daily, #30, 30 days starting 07/27/2016, Ref. x3. Started Ondansetron 4MG, 1 (one) Tablet every six hours, as needed, #20, 1 day starting 07/27/2016, No Refill. 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 Jennie Bolar, Copeland, FACS General, Bariatric, & Minimally Invasive Surgery [facility]   

## 2016-08-08 NOTE — Anesthesia Procedure Notes (Signed)
Procedure Name: Intubation Date/Time: 08/08/2016 10:13 AM Performed by: Thornell MuleSTUBBLEFIELD, Jonette Wassel G Pre-anesthesia Checklist: Patient identified, Emergency Drugs available, Suction available and Patient being monitored Patient Re-evaluated:Patient Re-evaluated prior to inductionOxygen Delivery Method: Circle system utilized Preoxygenation: Pre-oxygenation with 100% oxygen Intubation Type: IV induction Ventilation: Mask ventilation without difficulty Laryngoscope Size: Miller and 3 Grade View: Grade I Tube type: Oral Tube size: 7.0 mm Number of attempts: 1 Airway Equipment and Method: Stylet and Oral airway Placement Confirmation: ETT inserted through vocal cords under direct vision,  positive ETCO2 and breath sounds checked- equal and bilateral Secured at: 20 cm Tube secured with: Tape Dental Injury: Teeth and Oropharynx as per pre-operative assessment

## 2016-08-08 NOTE — Transfer of Care (Signed)
Immediate Anesthesia Transfer of Care Note  Patient: Kristin Copeland  Procedure(s) Performed: Procedure(s): LAPAROSCOPIC ROUX-EN-Y GASTRIC BYPASS WITH HIATAL HERNIA REPAIR, UPPER ENDO (N/A)  Patient Location: PACU  Anesthesia Type:General  Level of Consciousness: awake, alert  and oriented  Airway & Oxygen Therapy: Patient Spontanous Breathing and Patient connected to face mask oxygen  Post-op Assessment: Report given to RN and Post -op Vital signs reviewed and stable  Post vital signs: Reviewed and stable  Last Vitals:  Vitals:   08/08/16 0728  BP: 121/86  Pulse: 86  Resp: 16  Temp: 36.6 C    Last Pain:  Vitals:   08/08/16 0728  TempSrc: Oral      Patients Stated Pain Goal: 5 (08/08/16 0741)  Complications: No apparent anesthesia complications

## 2016-08-08 NOTE — Interval H&P Note (Signed)
History and Physical Interval Note:  08/08/2016 9:20 AM  [name]  has presented today for surgery, with the diagnosis of Morbid Obesity, OSA, GERD, Hiatal Hernia  The various methods of treatment have been discussed with the patient and family. After consideration of risks, benefits and other options for treatment, the patient has consented to  Procedure(s): LAPAROSCOPIC ROUX-EN-Y GASTRIC BYPASS WITH HIATAL HERNIA REPAIR, UPPER ENDO (N/A) as a surgical intervention .  The patient's history has been reviewed, patient examined, no change in status, stable for surgery.  I have reviewed the patient's chart and labs.  Questions were answered to the patient's satisfaction.    Kristin SellaEric M. Andrey CampanileWilson, MD, FACS General, Bariatric, & Minimally Invasive Surgery [facility] [place]    [facility] Hospital[facility]ILSON,Kevia Zaucha M

## 2016-08-08 NOTE — Op Note (Signed)
Kristin EbbsCasey S Copeland [id number] 05/29/1966. 08/08/2016  Preoperative diagnosis:    Obesity (BMI 37)   OSA on CPAP   Chronic low back pain without sciatica   Essential hypertension   Overflow stress urinary incontinence in female   Hiatal hernia   Hypercholesteremia  Postoperative  diagnosis:  1. same  Surgical procedure: Laparoscopic Roux-en-Y gastric bypass (ante-colic, ante-gastric); upper endoscopy  Surgeon: Atilano InaEric M Fulton Merry, M.D. FACS  Asst.: Feliciana RossettiLuke Kinsinger, MD FACS  Anesthesia: General plus 70cc exparel as TAPP block  Complications: None   EBL: Minimal   Drains: None   Disposition: PACU in good condition   Indications for procedure: 50yo female with morbid obesity who has been unsuccessful at sustained weight loss. The patient's comorbidities are listed above. We discussed the risk and benefits of surgery including but not limited to anesthesia risk, bleeding, infection, blood clot formation, anastomotic leak, anastomotic stricture, ulcer formation, death, respiratory complications, intestinal blockage, internal hernia, gallstone formation, vitamin and nutritional deficiencies, injury to surrounding structures, failure to lose weight and mood changes.   Description of procedure: Patient is brought to the operating room and general anesthesia induced. The patient had received preoperative broad-spectrum IV antibiotics and subcutaneous heparin. The abdomen was widely sterilely prepped with Chloraprep and draped. Patient timeout was performed and correct patient and procedure confirmed. Access was obtained with a 12 mm Optiview trocar in the left upper quadrant and pneumoperitoneum established without difficulty. Under direct vision 12 mm trocars were placed laterally in the right upper quadrant, right upper quadrant midclavicular line, and to the left and above the umbilicus for the camera port. A 5 mm trocar was placed laterally in the left upper quadrant.  The omentum was brought into the  upper abdomen and the transverse mesocolon elevated and the ligament of Treitz clearly identified. A 40 cm biliopancreatic limb was then carefully measured from the ligament of Treitz. The small intestine was divided at this point with a single firing of the white load linear stapler. A Penrose drain was sutured to the end of the Roux-en-Y limb for later identification. A 100 cm Roux-en-Y limb was then carefully measured. At this point a side-to-side anastomosis was created between the Roux limb and the end of the biliopancreatic limb. This was accomplished with a single firing of the 60 mm white load linear stapler. The common enterotomy was closed with a running 2-0 Vicryl begun at either end of the enterotomy and tied centrally. There was a small gap in the closure so an additional interrupted 2-0 vicryl suture was placed. Eviceal tissue sealant was placed over the anastomosis. The mesenteric defect was then closed with running 2-0 silk. The omentum was then divided with the harmonic scalpel up towards the transverse colon to allow mobility of the Roux limb toward the gastric pouch. The patient was then placed in steep reversed Trendelenburg. Through a 5 mm subxiphoid site the [facility] was placed and the left lobe of the liver elevated with excellent exposure of the upper stomach and hiatus. The angle of Hiss was then mobilized with the harmonic scalpel.  There was a small anterior dimple that was obviously visible. The calibration tube was placed in the oropharynx and guided down into the stomach by the CRNA. 10 mL of air was insufflated into the calibration balloon. The calibration tubing was then gently pulled back by the CRNA and it slid past the GE junction. At this point the calibration tubing was desufflated and pulled back into the esophagus. This confirmed  my suspicion of a clinically significant hiatal hernia. The gastrohepatic ligament was incised with harmonic scalpel. The right crus was  identified. We identified the crossing fat along the right crus. The adipose tissue just above this area was incised with harmonic scalpel. I then bluntly dissected out this area and identified the left crus. There was evidence of a hiatal hernia. I then mobilized the esophagus. The left and right crus were further mobilized with blunt dissection. I was then able to reapproximate the left and right crus with 0 Ethibond using an Endostitch suture device and securing it with a titanium tyknot.  We then had the CRNA readvanced the calibration tubing back into the stomach. 10 mL of air was insufflated into the calibration tube balloon. The calibration tube was then gently pulled back and there was resistance at the GE junction. The tube did not slide back up into the esophagus. At this point the calibration tubing was deflated and removed from the patient's body.   A 5 cm gastric pouch was then carefully measured along the lesser curve of the stomach. Dissection was carried along the lesser curve at this point with the Harmonic scalpel working carefully back toward the lesser sac at right angles to the lesser curve. The free lesser sac was then entered. After being sure all tubes were removed from the stomach an initial firing of the blue load 60 mm linear stapler was fired at right angles across the lesser curve for about 4 cm. The gastric pouch was further mobilized posteriorly and then the pouch was completed with 2 further firings of the 60 mm blue load linear stapler up through the previously dissected angle of His. It was ensured that the pouch was completely mobilized away from the gastric remnant. This created a nice tubular 4-5 cm gastric pouch. The Roux limb was then brought up in an antecolic fashion with the candycane facing to the patient's left without undue tension. The gastrojejunostomy was created with an initial posterior row of 2-0 Vicryl between the Roux limb and the staple line of the gastric  pouch. Enterotomies were then made in the gastric pouch and the Roux limb with the harmonic scalpel and at approximately 2-2-1/2 cm anastomosis was created with a single firing of the 45mm blue load linear stapler. The staple line was inspected and was intact without bleeding. The common enterotomy was then closed with running 2-0 Vicryl begun at either end and tied centrally. The Ewall tube was then easily passed through the anastomosis and an outer anterior layer of running 2-0 Vicryl was placed. The Ewald tube was removed. With the outlet of the gastrojejunostomy clamped and under saline irrigation the assistant performed upper endoscopy and with the gastric pouch tensely distended with air-there was no evidence of leak on this test. The pouch was desufflated. The Vonita Mosseterson defect was closed with running 2-0 silk. The abdomen was inspected for any evidence of bleeding or bowel injury and everything looked fine. The Nathanson retractor was removed under direct vision after coating the anastomosis with Eviceal tissue sealant. All CO2 was evacuated and trochars removed. Skin incisions were closed with 4-0 monocryl in a subcuticular fashion followed by Dermabond. Sponge needle and instrument counts were correct. The patient was taken to the PACU in good condition.    Mary SellaEric M. Andrey CampanileWilson, MD, FACS General, Bariatric, & Minimally Invasive Surgery [facility], [place]

## 2016-08-08 NOTE — Anesthesia Preprocedure Evaluation (Addendum)
Anesthesia Evaluation  Patient identified by MRN, date of birth, ID band Patient awake    Reviewed: Allergy & Precautions, H&P , Patient's Chart, lab work & pertinent test results, reviewed documented beta blocker date and time   Airway Mallampati: III  TM Distance: >3 FB Neck ROM: full    Dental no notable dental hx.    Pulmonary    Pulmonary exam normal breath sounds clear to auscultation       Cardiovascular hypertension, On Medications  Rhythm:regular Rate:Normal     Neuro/Psych    GI/Hepatic GERD  ,  Endo/Other  Morbid obesity  Renal/GU      Musculoskeletal   Abdominal   Peds  Hematology   Anesthesia Other Findings NSR;Normal  Reproductive/Obstetrics                            Anesthesia Physical Anesthesia Plan  ASA: III  Anesthesia Plan: General   Post-op Pain Management:    Induction: Intravenous  Airway Management Planned: Oral ETT  Additional Equipment:   Intra-op Plan:   Post-operative Plan: Extubation in OR  Informed Consent: I have reviewed the patients History and Physical, chart, labs and discussed the procedure including the risks, benefits and alternatives for the proposed anesthesia with the patient or authorized representative who has indicated his/her understanding and acceptance.   Dental Advisory Given and Dental advisory given  Plan Discussed with: CRNA and Surgeon  Anesthesia Plan Comments: (  Discussed general anesthesia, including possible nausea, instrumentation of airway, sore throat,pulmonary aspiration, etc. I asked if the were any outstanding questions, or  concerns before we proceeded. )        Anesthesia Quick Evaluation

## 2016-08-08 NOTE — Anesthesia Postprocedure Evaluation (Signed)
Anesthesia Post Note  Patient: Kristin Copeland  Procedure(s) Performed: Procedure(s) (LRB): LAPAROSCOPIC ROUX-EN-Y GASTRIC BYPASS WITH HIATAL HERNIA REPAIR, UPPER ENDO (N/A)  Patient location during evaluation: PACU Anesthesia Type: General Level of consciousness: sedated Pain management: satisfactory to patient Vital Signs Assessment: post-procedure vital signs reviewed and stable Respiratory status: spontaneous breathing Cardiovascular status: stable Anesthetic complications: no    Last Vitals:  Vitals:   08/08/16 1754 08/08/16 1845  BP: (!) 148/91 (!) 142/96  Pulse: 84 87  Resp: 15 15  Temp: 36.4 C 36.5 C    Last Pain:  Vitals:   08/08/16 1845  TempSrc: Oral  PainSc:                  [name],[name]

## 2016-08-09 LAB — COMPREHENSIVE METABOLIC PANEL
ALBUMIN: 3.9 g/dL (ref 3.5–5.0)
ALT: 55 U/L — AB (ref 14–54)
ANION GAP: 7 (ref 5–15)
AST: 42 U/L — ABNORMAL HIGH (ref 15–41)
Alkaline Phosphatase: 71 U/L (ref 38–126)
BUN: 8 mg/dL (ref 6–20)
CHLORIDE: 104 mmol/L (ref 101–111)
CO2: 24 mmol/L (ref 22–32)
Calcium: 8.8 mg/dL — ABNORMAL LOW (ref 8.9–10.3)
Creatinine, Ser: 0.7 mg/dL (ref 0.44–1.00)
GFR calc non Af Amer: >60 mL/min (ref >60)
GLUCOSE: 142 mg/dL — AB (ref 65–99)
Potassium: 4.2 mmol/L (ref 3.5–5.1)
SODIUM: 135 mmol/L (ref 135–145)
Total Bilirubin: 0.5 mg/dL (ref 0.3–1.2)
Total Protein: 6.6 g/dL (ref 6.5–8.1)

## 2016-08-09 LAB — CBC WITH DIFFERENTIAL/PLATELET
BASOS PCT: 0 %
Basophils Absolute: 0 10*3/uL (ref 0.0–0.1)
EOS ABS: 0 10*3/uL (ref 0.0–0.7)
EOS PCT: 0 %
HCT: 35 % — ABNORMAL LOW (ref 36.0–46.0)
Hemoglobin: 11.9 g/dL — ABNORMAL LOW (ref 12.0–15.0)
LYMPHS ABS: 1.1 10*3/uL (ref 0.7–4.0)
Lymphocytes Relative: 12 %
MCH: 29.6 pg (ref 26.0–34.0)
MCHC: 34 g/dL (ref 30.0–36.0)
MCV: 87.1 fL (ref 78.0–100.0)
Monocytes Absolute: 0.7 10*3/uL (ref 0.1–1.0)
Monocytes Relative: 8 %
NEUTROS PCT: 80 %
Neutro Abs: 6.8 10*3/uL (ref 1.7–7.7)
PLATELETS: 266 10*3/uL (ref 150–400)
RBC: 4.02 MIL/uL (ref 3.87–5.11)
RDW: 12.7 % (ref 11.5–15.5)
WBC: 8.6 10*3/uL (ref 4.0–10.5)

## 2016-08-09 LAB — HEMOGLOBIN AND HEMATOCRIT, BLOOD
HEMATOCRIT: 37 % (ref 36.0–46.0)
Hemoglobin: 12.3 g/dL (ref 12.0–15.0)

## 2016-08-09 MED ORDER — ALUM & MAG HYDROXIDE-SIMETH 200-200-20 MG/5ML PO SUSP: 15.0000 mL | Freq: Four times a day (QID) | ORAL | Status: DC | PRN

## 2016-08-09 MED ORDER — PROPRANOLOL HCL 20 MG/5ML PO SOLN
30.0000 mg | Freq: Two times a day (BID) | ORAL | Status: DC
  Administered 2016-08-10: 30 mg via ORAL
  Filled 2016-08-09 (×2): qty 7.5

## 2016-08-09 NOTE — Progress Notes (Signed)
Paged MD about loss of IV access. Patient to start shakes in am and having adequate fluid intake. Only IV medication metoprolol. Will observe intake/output & encourage fluids.

## 2016-08-09 NOTE — Plan of Care (Signed)
Problem: Altered GI Function (-1.4) Goal: Nutrition education Formal process to instruct or train a patient/client in a skill or to impart knowledge to help patients/clients voluntarily manage or modify food choices and eating behavior to maintain or improve health. Outcome: Completed/Met Date Met: 08/09/16 Nutrition Education Note  Received consult for diet education per DROP protocol.   Patient reports she is tolerating sips of water and has been ambulating. She has Premier Protein shake for home and enjoys it. She has already purchased appropriate vitamin/mineral supplements. No questions at this time.  Discussed 2 week post op diet with pt. Emphasized that liquids must be non carbonated, non caffeinated, and sugar free. Fluid goals discussed. Pt to follow up with outpatient bariatric RD for further diet progression after 2 weeks. Multivitamins and minerals also reviewed. Teach back method used, pt expressed understanding, expect good compliance.   Diet: First 2 Weeks  You will see the nutritionist about two (2) weeks after your surgery. The nutritionist will increase the types of foods you can eat if you are handling liquids well:  If you have severe vomiting or nausea and cannot handle clear liquids lasting longer than 1 day, call your surgeon  Protein Shake  Drink at least 2 ounces of shake 5-6 times per day  Each serving of protein shakes (usually 8 - 12 ounces) should have a minimum of:  15 grams of protein  And no more than 5 grams of carbohydrate  Goal for protein each day:  Men = 80 grams per day  Women = 60 grams per day  Protein powder may be added to fluids such as non-fat milk or Lactaid milk or Soy milk (limit to 35 grams added protein powder per serving)   Hydration  Slowly increase the amount of water and other clear liquids as tolerated (See Acceptable Fluids)  Slowly increase the amount of protein shake as tolerated  Sip fluids slowly and throughout the day  May  use sugar substitutes in small amounts (no more than 6 - 8 packets per day; i.e. Splenda)   Fluid Goal  The first goal is to drink at least 8 ounces of protein shake/drink per day (or as directed by the nutritionist); some examples of protein shakes are [employer], [employer], EAS Edge HP, and Unjury. See handout from pre-op Bariatric Education Class:  Slowly increase the amount of protein shake you drink as tolerated  You may find it easier to slowly sip shakes throughout the day  It is important to get your proteins in first  Your fluid goal is to drink 64 - 100 ounces of fluid daily  It may take a few weeks to build up to this  32 oz (or more) should be clear liquids  And  32 oz (or more) should be full liquids (see below for examples)  Liquids should not contain sugar, caffeine, or carbonation   Clear Liquids:  Water or Sugar-free flavored water (i.e. Fruit H2O, Propel)  Decaffeinated coffee or tea (sugar-free)  Crystal Lite, Wyler's Lite, Minute Maid Lite  Sugar-free Jell-O  Bouillon or broth  Sugar-free Popsicle: *Less than 20 calories each; Limit 1 per day   Full Liquids:  Protein Shakes/Drinks + 2 choices per day of other full liquids  Full liquids must be:  No More Than 12 grams of Carbs per serving  No More Than 3 grams of Fat per serving  Strained low-fat cream soup  Non-Fat milk  Fat-free Lactaid Milk  Sugar-free yogurt (Dannon Lite &  Fit, Turner yogurt)   Willey Blade, MS, RD, LDN Pager: [phone] After Hours Pager: [phone]

## 2016-08-09 NOTE — Progress Notes (Signed)
1 Day Post-Op  Subjective: Doing well. Took in about 2 oz water last night. Some gas discomfort. Ambulated.   Objective: Vital signs in last 24 hours: Temp:  [97.5 F (36.4 C)-98.7 F (37.1 C)] 98.1 F (36.7 C) (11/29 0516) Pulse Rate:  [63-87] 76 (11/29 0516) Resp:  [14-18] 16 (11/29 0516) BP: (124-161)/(66-99) 124/79 (11/29 0516) SpO2:  [97 %-100 %] 98 % (11/29 0516) Last BM Date: 08/07/16  Intake/Output from previous day: 11/28 0701 - 11/29 0700 In: 3677.9 [P.O.:80; I.V.:3297.9; IV Piggyback:300] Out: 1250 [Urine:1200; Blood:50] Intake/Output this shift: No intake/output data recorded.  Alert, resting comfortably, not ill appearing cta Reg Soft, incisions c/d/i; min TTP No edema  Lab Results:   Recent Labs  08/08/16 1332 08/09/16 0504  WBC  --  8.6  HGB 12.2 11.9*  HCT 37.7 35.0*  PLT  --  266   BMET  Recent Labs  08/09/16 0504  NA 135  K 4.2  CL 104  CO2 24  GLUCOSE 142*  BUN 8  CREATININE 0.70  CALCIUM 8.8*   PT/INR No results for input(s): LABPROT, INR in the last 72 hours. ABG No results for input(s): PHART, HCO3 in the last 72 hours.  Invalid input(s): PCO2, PO2  Studies/Results: No results found.  Anti-infectives: Anti-infectives    Start     Dose/Rate Route Frequency Ordered Stop   08/08/16 0723  cefoTEtan in Dextrose 5% (CEFOTAN) IVPB 2 g     2 g Intravenous On call to O.R. 08/08/16 0723 08/08/16 1015      Assessment/Plan: s/p Procedure(s): LAPAROSCOPIC ROUX-EN-Y GASTRIC BYPASS WITH HIATAL HERNIA REPAIR, UPPER ENDO (N/A) Principal Problem:   Obesity (BMI 30-39.9) Active Problems:   OSA on CPAP   Chronic low back pain without sciatica   Essential hypertension   Overflow stress urinary incontinence in female   Hiatal hernia   Hypercholesteremia   S/P gastric bypass  Doing well No fever, no tachycardia Water ad lib; will adv to protein shakes mid day if does well Chemical vte prophylaxis  Mary SellaEric M. Andrey CampanileWilson, MD,  FACS General, Bariatric, & Minimally Invasive Surgery [facility], [place]   LOS: 1 day    Atilano InaWILSON,Millette Halberstam M 08/09/2016

## 2016-08-09 NOTE — Progress Notes (Signed)
Received verbal order to switch to home dose of propranolol 60mg  solution daily due to loss of IV access.

## 2016-08-10 LAB — CBC WITH DIFFERENTIAL/PLATELET
BASOS ABS: 0 10*3/uL (ref 0.0–0.1)
BASOS PCT: 0 %
Eosinophils Absolute: 0 10*3/uL (ref 0.0–0.7)
Eosinophils Relative: 0 %
HEMATOCRIT: 37.4 % (ref 36.0–46.0)
HEMOGLOBIN: 12.2 g/dL (ref 12.0–15.0)
LYMPHS PCT: 22 %
Lymphs Abs: 1.8 10*3/uL (ref 0.7–4.0)
MCH: 29.1 pg (ref 26.0–34.0)
MCHC: 32.6 g/dL (ref 30.0–36.0)
MCV: 89.3 fL (ref 78.0–100.0)
MONO ABS: 0.5 10*3/uL (ref 0.1–1.0)
Monocytes Relative: 6 %
NEUTROS ABS: 6 10*3/uL (ref 1.7–7.7)
NEUTROS PCT: 72 %
Platelets: 295 10*3/uL (ref 150–400)
RBC: 4.19 MIL/uL (ref 3.87–5.11)
RDW: 13.4 % (ref 11.5–15.5)
WBC: 8.3 10*3/uL (ref 4.0–10.5)

## 2016-08-10 MED ORDER — METHYLPHENIDATE HCL 5 MG PO TABS: 5.0000 mg | ORAL_TABLET | Freq: Two times a day (BID) | ORAL | 0 refills | Status: DC

## 2016-08-10 MED ORDER — BUPROPION HCL 75 MG PO TABS: 150.0000 mg | ORAL_TABLET | Freq: Three times a day (TID) | ORAL | 0 refills | Status: DC

## 2016-08-10 MED ORDER — OXYCODONE HCL 5 MG/5ML PO SOLN: 5.0000 mg | ORAL | 0 refills | Status: DC | PRN

## 2016-08-10 MED ORDER — PROPRANOLOL HCL 20 MG PO TABS: 30.0000 mg | ORAL_TABLET | Freq: Two times a day (BID) | ORAL | 1 refills | Status: DC

## 2016-08-10 NOTE — Discharge Summary (Signed)
Physician Discharge Summary  Kristin EbbsCasey S Copeland [id number] DOB: 02/09/1966 DOA: 08/08/2016  PCP: Dorothey BasemanAVID BRONSTEIN, MD  Admit date: 08/08/2016 Discharge date: 08/10/2016  Recommendations for Outpatient Follow-up:  1.   Follow-up Information    Atilano InaWILSON,Leevon Upperman M, MD. Go on 08/30/2016.   Specialty:  General Surgery Why:  8:45 AM Contact information: [address] [place] [address] [phone]          Discharge Diagnoses:  Principal Problem:   Obesity (BMI 30-39.9) Active Problems:   OSA on CPAP   Chronic low back pain without sciatica   Essential hypertension   Overflow stress urinary incontinence in female   Hiatal hernia   Hypercholesteremia   S/P gastric bypass   Surgical Procedure: Laparoscopic Roux-en-Y gastric bypass, upper endoscopy  Discharge Condition: Good Disposition: Home  Diet recommendation: Postoperative gastric bypass diet  Filed Weights   08/08/16 0728 08/08/16 0741 08/09/16 2200  Weight: 86.6 kg (191 lb) 86.6 kg (191 lb) 88.8 kg (195 lb 11.2 oz)     Hospital Course:  The patient was admitted for a planned laparoscopic Roux-en-Y gastric bypass. Please see operative note. Preoperatively the patient was given 5000 units of subcutaneous heparin for DVT prophylaxis. Postoperative prophylactic Lovenox dosing was started on the morning of postoperative day 1.  The patient was started on ice chips and water which they tolerated on evening of POD 0. On pod 1 she was continued on water ad lib.  On postoperative day 2 The patient's diet was advanced to protein shakes which they also tolerated. The patient was ambulating without difficulty. Their vital signs are stable without fever or tachycardia. Their hemoglobin had remained stable. The patient was maintained on their home settings for CPAP therapy. The patient had received discharge instructions and counseling. They were deemed stable for discharge.  BP (!) 141/86 (BP Location: Left Arm)   Pulse 84    Temp 98.2 F (36.8 C) (Oral)   Resp 16   Ht 5' (1.524 m)   Wt 88.8 kg (195 lb 11.2 oz)   SpO2 97%   BMI 38.22 kg/m   Gen: alert, NAD, non-toxic appearing Pupils: equal, no scleral icterus Pulm: Lungs clear to auscultation, symmetric chest rise CV: regular rate and rhythm Abd: soft, min tenderness, nondistended.  No cellulitis. No incisional hernia Ext: no edema, no calf tenderness Skin: no rash, no jaundice    Discharge Instructions  Discharge Instructions    Ambulate hourly while awake    Complete by:  As directed    Call MD for:  difficulty breathing, headache or visual disturbances    Complete by:  As directed    Call MD for:  persistant dizziness or light-headedness    Complete by:  As directed    Call MD for:  persistant nausea and vomiting    Complete by:  As directed    Call MD for:  redness, tenderness, or signs of infection (pain, swelling, redness, odor or green/yellow discharge around incision site)    Complete by:  As directed    Call MD for:  severe uncontrolled pain    Complete by:  As directed    Call MD for:  temperature >101 F    Complete by:  As directed    Diet bariatric full liquid    Complete by:  As directed    Discharge instructions    Complete by:  As directed    See bariatric discharge instructions   Incentive spirometry    Complete  by:  As directed    Perform hourly while awake       Medication List    STOP taking these medications   buPROPion 150 MG 12 hr tablet Commonly known as:  WELLBUTRIN SR Replaced by:  buPROPion 75 MG tablet   methylphenidate 10 MG CR capsule Commonly known as:  METADATE CD Replaced by:  methylphenidate 5 MG tablet   propranolol ER 60 MG 24 hr capsule Commonly known as:  INDERAL LA Replaced by:  propranolol 20 MG tablet     TAKE these medications   buPROPion 75 MG tablet Commonly known as:  WELLBUTRIN Take 2 tablets (150 mg total) by mouth 3 (three) times daily. Replaces:  buPROPion 150 MG 12 hr  tablet   FERROUS SULFATE PO Take 1 tablet by mouth daily.   hyoscyamine 0.125 MG Tbdp disintergrating tablet Commonly known as:  ANASPAZ Take 0.125 mg by mouth daily. May take an additional 0.125 mgs as needed for stomach pain   LUBRICATING EYE DROPS OP Apply 1 drop to eye daily as needed (dry eyes).   methylphenidate 5 MG tablet Commonly known as:  RITALIN Take 1 tablet (5 mg total) by mouth 2 (two) times daily. Replaces:  methylphenidate 10 MG CR capsule   MULTI-VITAMINS Tabs Take 1 tablet by mouth daily.   oxyCODONE 5 MG/5ML solution Commonly known as:  ROXICODONE Take 5-10 mLs (5-10 mg total) by mouth every 4 (four) hours as needed for moderate pain or severe pain.   propranolol 20 MG tablet Commonly known as:  INDERAL Take 1.5 tablets (30 mg total) by mouth 2 (two) times daily. Replaces:  propranolol ER 60 MG 24 hr capsule Notes to patient:  Monitor Blood Pressure Daily and keep a log for primary care physician.  You may need to make changes to your medications with rapid weight loss.     topiramate 25 MG tablet Commonly known as:  TOPAMAX TAKE 3 TABLETS (75 MG TOTAL) BY MOUTH 2 (TWO) TIMES DAILY.      Follow-up Information    Atilano Ina, MD. Go on 08/30/2016.   Specialty:  General Surgery Why:  8:45 AM Contact information: [address] [place] [address] [phone]            The results of significant diagnostics from this hospitalization (including imaging, microbiology, ancillary and laboratory) are listed below for reference.    Significant Diagnostic Studies: No results found.  Labs: Basic Metabolic Panel:  Recent Labs Lab 08/09/16 0504  NA 135  K 4.2  CL 104  CO2 24  GLUCOSE 142*  BUN 8  CREATININE 0.70  CALCIUM 8.8*   Liver Function Tests:  Recent Labs Lab 08/09/16 0504  AST 42*  ALT 55*  ALKPHOS 71  BILITOT 0.5  PROT 6.6  ALBUMIN 3.9    CBC:  Recent Labs Lab 08/08/16 1332 08/09/16 0504  08/09/16 1558 08/10/16 0502  WBC  --  8.6  --  8.3  NEUTROABS  --  6.8  --  6.0  HGB 12.2 11.9* 12.3 12.2  HCT 37.7 35.0* 37.0 37.4  MCV  --  87.1  --  89.3  PLT  --  266  --  295    CBG: No results for input(s): GLUCAP in the last 168 hours.  Principal Problem:   Obesity (BMI 30-39.9) Active Problems:   OSA on CPAP   Chronic low back pain without sciatica   Essential hypertension   Overflow stress urinary incontinence  in female   Hiatal hernia   Hypercholesteremia   S/P gastric bypass   Time coordinating discharge: 15 min  Signed:  Atilano InaEric M Tabia Landowski, MD [facility], [place] [phone] 08/10/2016, 12:45 PM

## 2016-08-10 NOTE — Discharge Instructions (Signed)

## 2016-08-10 NOTE — Progress Notes (Signed)
Discharge instructions discussed with patient and family, verbalized agreement and understanding.  Prescription given to family 

## 2016-08-10 NOTE — Progress Notes (Signed)
Patient alert and oriented, pain is controlled. Patient is tolerating fluids, advanced to protein shake today, patient is tolerating well. Reviewed Gastric Bypass discharge instructions with patient and patient is able to articulate understanding. Provided information on BELT program, Support Group and [facility] outpatient pharmacy. All questions answered, will continue to monitor.    

## 2016-08-22 ENCOUNTER — Encounter: Payer: BLUE CROSS/BLUE SHIELD | Attending: General Surgery | Admitting: Dietician

## 2016-08-22 DIAGNOSIS — R0683 Snoring: Secondary | ICD-10-CM | POA: Insufficient documentation

## 2016-08-22 DIAGNOSIS — E669 Obesity, unspecified: Secondary | ICD-10-CM

## 2016-08-22 DIAGNOSIS — M545 Low back pain: Secondary | ICD-10-CM | POA: Diagnosis not present

## 2016-08-22 DIAGNOSIS — G43009 Migraine without aura, not intractable, without status migrainosus: Secondary | ICD-10-CM | POA: Diagnosis not present

## 2016-08-22 DIAGNOSIS — K219 Gastro-esophageal reflux disease without esophagitis: Secondary | ICD-10-CM | POA: Insufficient documentation

## 2016-08-22 DIAGNOSIS — Z713 Dietary counseling and surveillance: Secondary | ICD-10-CM | POA: Insufficient documentation

## 2016-08-23 NOTE — Progress Notes (Signed)
Bariatric Class:  Appt start time: 1530 end time:  1630.  2 Week Post-Operative Nutrition Class  Patient was seen on 08/23/2016 for Post-Operative Nutrition education at [facility].   Surgery date: 08/08/2016 Surgery type: RYGB Start weight at [facility]: 189 lbs on 05/01/2016, 194.4 on 07/24/2016 Weight today: 181.8 lbs  Weight change: 12.6 lbs  TANITA  BODY COMP RESULTS  07/24/16 08/23/16   BMI (kg/m^2) 38 35.5   Fat Mass (lbs) 92 85   Fat Free Mass (lbs) 102.4 96.8   Total Body Water (lbs) 73.4 68.8   The following the learning objectives were met by the patient during this course:  Identifies Phase 3A (Soft, High Proteins) Dietary Goals and will begin from 2 weeks post-operatively to 2 months post-operatively  Identifies appropriate sources of fluids and proteins   States protein recommendations and appropriate sources post-operatively  Identifies the need for appropriate texture modifications, mastication, and bite sizes when consuming solids  Identifies appropriate multivitamin and calcium sources post-operatively  Describes the need for physical activity post-operatively and will follow MD recommendations  States when to call healthcare provider regarding medication questions or post-operative complications  Handouts given during class include:  Phase 3A: Soft, High Protein Diet Handout  Follow-Up Plan: Patient will follow-up at [facility] in 6 weeks for 2 month post-op nutrition visit for diet advancement per MD.

## 2016-10-03 ENCOUNTER — Encounter: Payer: Self-pay | Admitting: Skilled Nursing Facility1

## 2016-10-03 ENCOUNTER — Encounter: Payer: BLUE CROSS/BLUE SHIELD | Attending: General Surgery | Admitting: Skilled Nursing Facility1

## 2016-10-03 DIAGNOSIS — M545 Low back pain: Secondary | ICD-10-CM | POA: Insufficient documentation

## 2016-10-03 DIAGNOSIS — K219 Gastro-esophageal reflux disease without esophagitis: Secondary | ICD-10-CM | POA: Insufficient documentation

## 2016-10-03 DIAGNOSIS — R0683 Snoring: Secondary | ICD-10-CM | POA: Insufficient documentation

## 2016-10-03 DIAGNOSIS — Z713 Dietary counseling and surveillance: Secondary | ICD-10-CM | POA: Insufficient documentation

## 2016-10-03 DIAGNOSIS — G43009 Migraine without aura, not intractable, without status migrainosus: Secondary | ICD-10-CM | POA: Insufficient documentation

## 2016-10-03 DIAGNOSIS — E669 Obesity, unspecified: Secondary | ICD-10-CM | POA: Insufficient documentation

## 2016-10-03 NOTE — Progress Notes (Signed)
  Follow-up visit:  8 Weeks Post-Operative RYGB Surgery  Medical Nutrition Therapy:  Appt start time: 11:00 end time:  11:30  Primary concerns today: Post-operative Bariatric Surgery Nutrition Management. Pt states she currently has a kidney stone. Pt states she is having trouble tolerating solid foods and is craving salt a lot.   Surgery date: 08/08/2016 Surgery type: RYGB Start weight at [facility]: 189 lbs on 05/01/2016, 194.4 on 07/24/2016 Weight today: 161.4 lbs  TANITA  BODY COMP RESULTS  07/24/16 08/23/16 10/03/2016   BMI (kg/m^2) 38 35.5 31.5   Fat Mass (lbs) 92 85 69   Fat Free Mass (lbs) 102.4 96.8 92.4   Total Body Water (lbs) 73.4 68.8 64.8     Preferred Learning Style:   No preference indicated   Learning Readiness:   Change in progress  24-hr recall: B (AM): protein shake Snk (AM): sugar free popcicles  L (PM): soup OR shake Snk (PM):  [place] yogurt D (PM): 2 bites of salmon and half protein shake Snk (PM):   Fluid intake: crystal light water, 2 protein shakes: 60 fluid ounces  Estimated total protein intake: about 80 grams of protein   Medications: See List Supplementation: Multivitamin and calcium   Using straws: NO Drinking while eating: YES: sips  Hair loss: NO Carbonated beverages: NO N/V/D/C: "Bad Nausea": YES-5 times vomiting after eating-chicken, soup: during initial stages of kidney stones, YES, YES  Recent physical activity:  Walking on treadmill 2 times a day 60 minutes 3 days a week  Progress Towards Goal(s):  In progress.  Handouts given during visit include:  NS vegetables + protein   Nutritional Diagnosis:  Huetter-3.3 Overweight/obesity related to past poor dietary habits and physical inactivity as evidenced by patient w/ recent RYGB surgery following dietary guidelines for continued weight loss.    Intervention:  Nutrition counseling for protein + NS vegetables and getting enough fluid after vomiting. Goals: .-Keep chewing thoroughly   -Wean off the protein shakes when you feel you are tolerating foods better -follow the diet  Teaching Method Utilized:  Visual Auditory Hands on  Barriers to learning/adherence to lifestyle change: non identified   Demonstrated degree of understanding via:  Teach Back   Monitoring/Evaluation:  Dietary intake, exercise, lap  and body weight.

## 2016-10-03 NOTE — Patient Instructions (Addendum)
-  Keep chewing thoroughly   -Wean off the protein shakes when you feel you are tolerating foods better  -follow the diet

## 2017-03-15 ENCOUNTER — Other Ambulatory Visit: Payer: Self-pay | Admitting: Podiatry

## 2017-03-20 ENCOUNTER — Encounter: Payer: Self-pay | Admitting: *Deleted

## 2017-03-30 NOTE — Discharge Instructions (Signed)
[facility] °[facility] ° °POST OPERATIVE INSTRUCTIONS FOR DR. TROXLER AND DR. FOWLER °[facility] ° ° °1. Take your medication as prescribed.  Pain medication should be taken only as needed. ° °2. Keep the dressing clean, dry and intact. ° °3. Keep your foot elevated above the heart level for the first 48 hours. ° °4. Walking to the bathroom and brief periods of walking are acceptable, unless we have instructed you to be non-weight bearing. ° °5. Always wear your post-op shoe when walking.  Always use your crutches if you are to be non-weight bearing. ° °6. Do not take a shower. Baths are permissible as long as the foot is kept out of the water.  ° °7. Every hour you are awake:  °- Bend your knee 15 times. °- Flex foot 15 times °- Massage calf 15 times ° °8. Call [facility] ([phone]) if any of the following problems occur: °- You develop a temperature or fever. °- The bandage becomes saturated with blood. °- Medication does not stop your pain. °- Injury of the foot occurs. °- Any symptoms of infection including redness, odor, or red streaks running from wound. ° ° °General Anesthesia, Adult, Care After °These instructions provide you with information about caring for yourself after your procedure. Your health care provider may also give you more specific instructions. Your treatment has been planned according to current medical practices, but problems sometimes occur. Call your health care provider if you have any problems or questions after your procedure. °What can I expect after the procedure? °After the procedure, it is common to have: °· Vomiting. °· A sore throat. °· Mental slowness. ° °It is common to feel: °· Nauseous. °· Cold or shivery. °· Sleepy. °· Tired. °· Sore or achy, even in parts of your body where you did not have surgery. ° °Follow these instructions at home: °For at least 24 hours after the procedure: °· Do not: °? Participate in  activities where you could fall or become injured. °? Drive. °? Use heavy machinery. °? Drink alcohol. °? Take sleeping pills or medicines that cause drowsiness. °? Make important decisions or sign legal documents. °? Take care of children on your own. °· Rest. °Eating and drinking °· If you vomit, drink water, juice, or soup when you can drink without vomiting. °· Drink enough fluid to keep your urine clear or pale yellow. °· Make sure you have little or no nausea before eating solid foods. °· Follow the diet recommended by your health care provider. °General instructions °· Have a responsible adult stay with you until you are awake and alert. °· Return to your normal activities as told by your health care provider. Ask your health care provider what activities are safe for you. °· Take over-the-counter and prescription medicines only as told by your health care provider. °· If you smoke, do not smoke without supervision. °· Keep all follow-up visits as told by your health care provider. This is important. °Contact a health care provider if: °· You continue to have nausea or vomiting at home, and medicines are not helpful. °· You cannot drink fluids or start eating again. °· You cannot urinate after 8-12 hours. °· You develop a skin rash. °· You have fever. °· You have increasing redness at the site of your procedure. °Get help right away if: °· You have difficulty breathing. °· You have chest pain. °· You have unexpected bleeding. °· You feel that you   are having a life-threatening or urgent problem. °This information is not intended to replace advice given to you by your health care provider. Make sure you discuss any questions you have with your health care provider. °Document Released: 12/04/2000 Document Revised: 01/31/2016 Document Reviewed: 08/12/2015 °Elsevier Interactive Patient Education © 2018 [employer] ° °

## 2017-04-04 ENCOUNTER — Ambulatory Visit: Payer: BLUE CROSS/BLUE SHIELD | Admitting: Anesthesiology

## 2017-04-04 ENCOUNTER — Encounter
Admission: RE | Disposition: A | Discharge status: Home / Self Care | Payer: Self-pay | Source: Ambulatory Visit | Attending: Podiatry

## 2017-04-04 ENCOUNTER — Ambulatory Visit
Admission: RE | Admit: 2017-04-04 | Discharge: 2017-04-04 | Disposition: A | Discharge status: Home / Self Care | Payer: BLUE CROSS/BLUE SHIELD | Source: Ambulatory Visit | Attending: Podiatry | Admitting: Podiatry

## 2017-04-04 DIAGNOSIS — I10 Essential (primary) hypertension: Secondary | ICD-10-CM | POA: Insufficient documentation

## 2017-04-04 DIAGNOSIS — Z79899 Other long term (current) drug therapy: Secondary | ICD-10-CM | POA: Diagnosis not present

## 2017-04-04 DIAGNOSIS — G4733 Obstructive sleep apnea (adult) (pediatric): Secondary | ICD-10-CM | POA: Diagnosis not present

## 2017-04-04 DIAGNOSIS — M2011 Hallux valgus (acquired), right foot: Secondary | ICD-10-CM | POA: Diagnosis not present

## 2017-04-04 DIAGNOSIS — K219 Gastro-esophageal reflux disease without esophagitis: Secondary | ICD-10-CM | POA: Insufficient documentation

## 2017-04-04 HISTORY — PX: HALLUX VALGUS AUSTIN: SHX6623

## 2017-04-04 HISTORY — DX: Attention-deficit hyperactivity disorder, unspecified type: F90.9

## 2017-04-04 HISTORY — DX: Calculus of kidney: N20.0

## 2017-04-04 SURGERY — CORRECTION, HALLUX VALGUS
Anesthesia: General | Laterality: Right | Wound class: Clean

## 2017-04-04 MED ORDER — DEXAMETHASONE SODIUM PHOSPHATE 4 MG/ML IJ SOLN
INTRAMUSCULAR | Status: DC | PRN
  Administered 2017-04-04: 4 mg via INTRAVENOUS

## 2017-04-04 MED ORDER — ONDANSETRON HCL 4 MG PO TABS: 4.0000 mg | ORAL_TABLET | Freq: Four times a day (QID) | ORAL | Status: DC | PRN

## 2017-04-04 MED ORDER — HYDROMORPHONE HCL 1 MG/ML IJ SOLN: 0.2500 mg | INTRAMUSCULAR | Status: DC | PRN

## 2017-04-04 MED ORDER — SODIUM CHLORIDE 0.9 % IV SOLN
INTRAVENOUS | Status: DC | PRN
  Administered 2017-04-04: 20 mL

## 2017-04-04 MED ORDER — CEFAZOLIN SODIUM-DEXTROSE 2-4 GM/100ML-% IV SOLN
2.0000 g | INTRAVENOUS | Status: AC
  Administered 2017-04-04: 2 g via INTRAVENOUS

## 2017-04-04 MED ORDER — HYDROCODONE-ACETAMINOPHEN 5-325 MG PO TABS: 1.0000 | ORAL_TABLET | ORAL | Status: DC | PRN

## 2017-04-04 MED ORDER — EPHEDRINE SULFATE 50 MG/ML IJ SOLN
INTRAMUSCULAR | Status: DC | PRN
  Administered 2017-04-04 (×6): 5 mg via INTRAVENOUS

## 2017-04-04 MED ORDER — ONDANSETRON HCL 4 MG/2ML IJ SOLN
INTRAMUSCULAR | Status: DC | PRN
  Administered 2017-04-04: 4 mg via INTRAVENOUS

## 2017-04-04 MED ORDER — FENTANYL CITRATE (PF) 100 MCG/2ML IJ SOLN
INTRAMUSCULAR | Status: DC | PRN
  Administered 2017-04-04 (×2): 50 ug via INTRAVENOUS

## 2017-04-04 MED ORDER — OXYCODONE HCL 5 MG/5ML PO SOLN: 5.0000 mg | Freq: Once | ORAL | Status: DC | PRN

## 2017-04-04 MED ORDER — LACTATED RINGERS IV SOLN
INTRAVENOUS | Status: DC
  Administered 2017-04-04: 11:00:00 via INTRAVENOUS

## 2017-04-04 MED ORDER — ACETAMINOPHEN 160 MG/5ML PO SOLN: 325.0000 mg | ORAL | Status: DC | PRN

## 2017-04-04 MED ORDER — PROPOFOL 10 MG/ML IV BOLUS
INTRAVENOUS | Status: DC | PRN
  Administered 2017-04-04: 200 mg via INTRAVENOUS

## 2017-04-04 MED ORDER — OXYCODONE HCL 5 MG PO TABS: 5.0000 mg | ORAL_TABLET | Freq: Once | ORAL | Status: DC | PRN

## 2017-04-04 MED ORDER — ONDANSETRON HCL 4 MG/2ML IJ SOLN: 4.0000 mg | Freq: Four times a day (QID) | INTRAMUSCULAR | Status: DC | PRN

## 2017-04-04 MED ORDER — POVIDONE-IODINE 7.5 % EX SOLN
Freq: Once | CUTANEOUS | Status: AC
  Administered 2017-04-04: 11:00:00 via TOPICAL

## 2017-04-04 MED ORDER — HYDROCODONE-ACETAMINOPHEN 5-325 MG PO TABS: 1.0000 | ORAL_TABLET | Freq: Four times a day (QID) | ORAL | 0 refills | Status: DC | PRN

## 2017-04-04 MED ORDER — LIDOCAINE HCL (CARDIAC) 20 MG/ML IV SOLN
INTRAVENOUS | Status: DC | PRN
  Administered 2017-04-04: 50 mg via INTRATRACHEAL

## 2017-04-04 MED ORDER — ONDANSETRON HCL 4 MG/2ML IJ SOLN
4.0000 mg | Freq: Once | INTRAMUSCULAR | Status: AC | PRN
  Administered 2017-04-04: 4 mg via INTRAVENOUS

## 2017-04-04 MED ORDER — ACETAMINOPHEN 325 MG PO TABS: 325.0000 mg | ORAL_TABLET | ORAL | Status: DC | PRN

## 2017-04-04 MED ORDER — MIDAZOLAM HCL 5 MG/5ML IJ SOLN
INTRAMUSCULAR | Status: DC | PRN
  Administered 2017-04-04: 2 mg via INTRAVENOUS

## 2017-04-04 SURGICAL SUPPLY — 42 items
BANDAGE ELASTIC 4 VELCRO NS (GAUZE/BANDAGES/DRESSINGS) ×2 IMPLANT
BENZOIN TINCTURE PRP APPL 2/3 (GAUZE/BANDAGES/DRESSINGS) ×2 IMPLANT
BLADE MED AGGRESSIVE (BLADE) ×2 IMPLANT
BLADE SURG 15 STRL LF DISP TIS (BLADE) ×1 IMPLANT
BLADE SURG 15 STRL SS (BLADE) ×1
BNDG COHESIVE 4X5 TAN STRL (GAUZE/BANDAGES/DRESSINGS) ×2 IMPLANT
BNDG ESMARK 4X12 TAN STRL LF (GAUZE/BANDAGES/DRESSINGS) ×2 IMPLANT
BNDG GAUZE 4.5X4.1 6PLY STRL (MISCELLANEOUS) ×2 IMPLANT
BNDG STRETCH 4X75 STRL LF (GAUZE/BANDAGES/DRESSINGS) ×2 IMPLANT
CANISTER SUCT 1200ML W/VALVE (MISCELLANEOUS) ×2 IMPLANT
COVER LIGHT HANDLE UNIVERSAL (MISCELLANEOUS) ×4 IMPLANT
CUFF TOURN SGL QUICK 18 (TOURNIQUET CUFF) ×2 IMPLANT
DRAPE FLUOR MINI C-ARM 54X84 (DRAPES) ×2 IMPLANT
DURAPREP 26ML APPLICATOR (WOUND CARE) ×2 IMPLANT
GAUZE PETRO XEROFOAM 1X8 (MISCELLANEOUS) ×2 IMPLANT
GAUZE SPONGE 4X4 12PLY STRL (GAUZE/BANDAGES/DRESSINGS) ×2 IMPLANT
GOWN STRL REUS W/ TWL LRG LVL3 (GOWN DISPOSABLE) ×2 IMPLANT
GOWN STRL REUS W/TWL LRG LVL3 (GOWN DISPOSABLE) ×2
K-WIRE DBL END TROCAR 6X.045 (WIRE) ×2
K-WIRE DBL END TROCAR 6X.062 (WIRE) ×2
K-wire (Wire) ×2 IMPLANT
KIT ROOM TURNOVER OR (KITS) ×2 IMPLANT
KWIRE DBL END TROCAR 6X.045 (WIRE) ×1 IMPLANT
KWIRE DBL END TROCAR 6X.062 (WIRE) ×1 IMPLANT
NS IRRIG 500ML POUR BTL (IV SOLUTION) ×2 IMPLANT
PACK EXTREMITY ARMC (MISCELLANEOUS) ×2 IMPLANT
PAD GROUND ADULT SPLIT (MISCELLANEOUS) ×2 IMPLANT
PIN BALLS 3/8 F/.045 WIRE (MISCELLANEOUS) IMPLANT
PUTTY DBM OPTIUM 1CC (Bone Implant) ×2 IMPLANT
RASP SM TEAR CROSS CUT (RASP) ×2 IMPLANT
STOCKINETTE IMPERVIOUS LG (DRAPES) ×2 IMPLANT
STRAP BODY AND KNEE 60X3 (MISCELLANEOUS) ×4 IMPLANT
STRIP CLOSURE SKIN 1/4X4 (GAUZE/BANDAGES/DRESSINGS) ×2 IMPLANT
SUCTION FRAZIER HANDLE 10FR (MISCELLANEOUS) ×1
SUCTION TUBE FRAZIER 10FR DISP (MISCELLANEOUS) ×1 IMPLANT
SUT MNCRL 5-0+ PC-1 (SUTURE) ×1 IMPLANT
SUT MONOCRYL 5-0 (SUTURE) ×1
SUT VIC AB 2-0 SH 27 (SUTURE)
SUT VIC AB 2-0 SH 27XBRD (SUTURE) IMPLANT
SUT VIC AB 3-0 SH 27 (SUTURE) ×1
SUT VIC AB 3-0 SH 27X BRD (SUTURE) ×1 IMPLANT
SUT VIC AB 4-0 FS2 27 (SUTURE) ×2 IMPLANT

## 2017-04-04 NOTE — Op Note (Signed)
Operative note   Surgeon:Varvara Legault Armed forces logistics/support/administrative officerowler    Assistant: None    Preop diagnosis: Right foot hallux valgus    Postop diagnosis: Same    Procedure: Austin hallux valgus correction right foot    EBL: Minimal    Anesthesia:local and general    Hemostasis: Midcalf tourniquet inflated to 200 mmHg for approximately 55 minutes    Specimen: None    Complications: None    Operative indications:Kristin Copeland is an 51 y.o. that presents today for surgical intervention.  The risks/benefits/alternatives/complications have been discussed and consent has been given.    Procedure:  Patient was brought into the OR and placed on the operating table in thesupine position. After anesthesia was obtained theright lower extremity was prepped and draped in usual sterile fashion.  After inflation of the tourniquet attention was directed to the dorsomedial right first MTPJ where a longitudinal incision was performed. Sharp and blunt dissection was carried down to the capsule. A T capsulotomy was then performed. The prominent dorsal medial eminence was noted and transected with a power saw. A V osteotomy was created and the capital fragment translocated laterally to the metatarsal head. This was stabilized with a 0.062 K wire. The ensuing overhanging ledge was transected and smoothed with a power rasp. Good stability was noted. Good realignment of the metatarsophalangeal joint was noted. A small capsulorrhaphy was created medially. All wounds were flushed with copious amounts or irrigation. Good realignment was noted in all planes and grossly. At this time the Akin osteotomy was not performed with realignment of the great toe noted. Closure was then performed with a 3-0 Vicryl for the deeper layer, 4-0 Vicryl for the subcutaneous tissue and a 5-0 Monocryl undyed. The wound was then infiltrated with 10 cc of extra mixed with 5 cc of 0.75% bupivacaine and 5 cc of sterile saline.    Patient tolerated the procedure and  anesthesia well.  Was transported from the OR to the PACU with all vital signs stable and vascular status intact. To be discharged per routine protocol.  Will follow up in approximately 1 week in the outpatient clinic.

## 2017-04-04 NOTE — Anesthesia Postprocedure Evaluation (Signed)
Anesthesia Post Note  Patient: Kristin Copeland  Procedure(s) Performed: Procedure(s) (LRB): Austin hallux valgus correction right foot (Right)  Patient location during evaluation: PACU Anesthesia Type: General Level of consciousness: awake and alert Pain management: pain level controlled Vital Signs Assessment: post-procedure vital signs reviewed and stable Respiratory status: spontaneous breathing, nonlabored ventilation, respiratory function stable and patient connected to nasal cannula oxygen Cardiovascular status: blood pressure returned to baseline and stable Postop Assessment: no signs of nausea or vomiting Anesthetic complications: no    Alta CorningBacon, Maha Fischel S

## 2017-04-04 NOTE — Anesthesia Procedure Notes (Signed)
Procedure Name: LMA Insertion Date/Time: 04/04/2017 12:19 PM Performed by: Andee PolesBUSH, Arlene Genova Pre-anesthesia Checklist: Patient identified, Emergency Drugs available, Suction available, Timeout performed and Patient being monitored Patient Re-evaluated:Patient Re-evaluated prior to induction Oxygen Delivery Method: Circle system utilized Preoxygenation: Pre-oxygenation with 100% oxygen Induction Type: IV induction LMA: LMA inserted LMA Size: 4.0 Number of attempts: 1 Placement Confirmation: positive ETCO2 and breath sounds checked- equal and bilateral Tube secured with: Tape

## 2017-04-04 NOTE — H&P (Signed)
HISTORY AND PHYSICAL INTERVAL NOTE:  04/04/2017  11:42 AM  Kristin Copeland  has presented today for surgery, with the diagnosis of Hallux Valgus right  M20.11.  The various methods of treatment have been discussed with the patient.  No guarantees were given.  After consideration of risks, benefits and other options for treatment, the patient has consented to surgery.  I have reviewed the patients' chart and labs.    Patient Vitals for the past 24 hrs:  BP Temp Temp src Pulse Resp SpO2 Height Weight  04/04/17 1105 (!) 132/92 97.7 F (36.5 C) Temporal 68 16 100 % 5' (1.524 m) 55.8 kg (123 lb)    A history and physical examination was performed in my office.  The patient was reexamined.  There have been no changes to this history and physical examination.  Kristin Copeland, Rhyker Silversmith A

## 2017-04-04 NOTE — Transfer of Care (Signed)
Immediate Anesthesia Transfer of Care Note  Patient: Kristin Copeland  Procedure(s) Performed: Procedure(s) with comments: HALLUX VALGUS AUSTIN-DOUBLE OSTEOTOMY (Right) - Latex sensitivity  Patient Location: PACU  Anesthesia Type: General  Level of Consciousness: awake, alert  and patient cooperative  Airway and Oxygen Therapy: Patient Spontanous Breathing and Patient connected to supplemental oxygen  Post-op Assessment: Post-op Vital signs reviewed, Patient's Cardiovascular Status Stable, Respiratory Function Stable, Patent Airway and No signs of Nausea or vomiting  Post-op Vital Signs: Reviewed and stable  Complications: No apparent anesthesia complications

## 2017-04-04 NOTE — Anesthesia Preprocedure Evaluation (Signed)
Anesthesia Evaluation  Patient identified by MRN, date of birth, ID band Patient awake    Reviewed: Allergy & Precautions, H&P , NPO status , Patient's Chart, lab work & pertinent test results, reviewed documented beta blocker date and time   Airway Mallampati: II  TM Distance: >3 FB Neck ROM: full    Dental no notable dental hx.    Pulmonary sleep apnea and Continuous Positive Airway Pressure Ventilation ,    Pulmonary exam normal breath sounds clear to auscultation       Cardiovascular Exercise Tolerance: Good hypertension, Normal cardiovascular exam Rhythm:regular Rate:Normal     Neuro/Psych negative neurological ROS  negative psych ROS   GI/Hepatic Neg liver ROS, GERD  Controlled and Medicated,  Endo/Other  negative endocrine ROS  Renal/GU negative Renal ROS  negative genitourinary   Musculoskeletal   Abdominal   Peds  Hematology negative hematology ROS (+)   Anesthesia Other Findings   Reproductive/Obstetrics negative OB ROS                             Anesthesia Physical Anesthesia Plan  ASA: II  Anesthesia Plan: General   Post-op Pain Management:    Induction:   PONV Risk Score and Plan:   Airway Management Planned:   Additional Equipment:   Intra-op Plan:   Post-operative Plan:   Informed Consent: I have reviewed the patients History and Physical, chart, labs and discussed the procedure including the risks, benefits and alternatives for the proposed anesthesia with the patient or authorized representative who has indicated his/her understanding and acceptance.   Dental Advisory Given  Plan Discussed with: CRNA  Anesthesia Plan Comments:         Anesthesia Quick Evaluation

## 2017-04-05 ENCOUNTER — Encounter: Payer: Self-pay | Admitting: Podiatry

## 2017-07-05 ENCOUNTER — Encounter: Payer: Self-pay | Admitting: Emergency Medicine

## 2017-07-05 DIAGNOSIS — N200 Calculus of kidney: Secondary | ICD-10-CM | POA: Diagnosis not present

## 2017-07-05 DIAGNOSIS — N1330 Unspecified hydronephrosis: Secondary | ICD-10-CM | POA: Diagnosis not present

## 2017-07-05 DIAGNOSIS — Z79899 Other long term (current) drug therapy: Secondary | ICD-10-CM | POA: Diagnosis not present

## 2017-07-05 DIAGNOSIS — I10 Essential (primary) hypertension: Secondary | ICD-10-CM | POA: Diagnosis not present

## 2017-07-05 DIAGNOSIS — R109 Unspecified abdominal pain: Secondary | ICD-10-CM | POA: Diagnosis present

## 2017-07-05 LAB — BASIC METABOLIC PANEL
Anion gap: 6 (ref 5–15)
BUN: 12 mg/dL (ref 6–20)
CALCIUM: 9.3 mg/dL (ref 8.9–10.3)
CHLORIDE: 111 mmol/L (ref 101–111)
CO2: 23 mmol/L (ref 22–32)
CREATININE: 0.91 mg/dL (ref 0.44–1.00)
Glucose, Bld: 113 mg/dL — ABNORMAL HIGH (ref 65–99)
Potassium: 3.9 mmol/L (ref 3.5–5.1)
SODIUM: 140 mmol/L (ref 135–145)

## 2017-07-05 LAB — CBC
HCT: 35.7 % (ref 35.0–47.0)
HEMOGLOBIN: 12.1 g/dL (ref 12.0–16.0)
MCH: 30.5 pg (ref 26.0–34.0)
MCHC: 33.9 g/dL (ref 32.0–36.0)
MCV: 89.8 fL (ref 80.0–100.0)
Platelets: 301 10*3/uL (ref 150–440)
RBC: 3.97 MIL/uL (ref 3.80–5.20)
RDW: 12.5 % (ref 11.5–14.5)
WBC: 10 10*3/uL (ref 3.6–11.0)

## 2017-07-05 LAB — URINALYSIS, COMPLETE (UACMP) WITH MICROSCOPIC
Bacteria, UA: NONE SEEN
Glucose, UA: NEGATIVE mg/dL
HGB URINE DIPSTICK: NEGATIVE
Ketones, ur: 20 mg/dL — AB
Nitrite: NEGATIVE
PH: 6 (ref 5.0–8.0)
Protein, ur: NEGATIVE mg/dL
Specific Gravity, Urine: 1.017 (ref 1.005–1.030)

## 2017-07-05 NOTE — ED Triage Notes (Signed)
Pt comes into the ED via POV c/o right flank pain that started with sudden onset today.  Patient has a h/o of kidney stones and stats this feels similar.  Explains that she has the urgency to urinate but very little is coming out.  Denies any fevers or any other pain at this time.

## 2017-07-06 ENCOUNTER — Emergency Department: Payer: BLUE CROSS/BLUE SHIELD

## 2017-07-06 ENCOUNTER — Emergency Department
Admission: EM | Admit: 2017-07-06 | Discharge: 2017-07-06 | Disposition: A | Discharge status: Home / Self Care | Payer: BLUE CROSS/BLUE SHIELD | Attending: Emergency Medicine | Admitting: Emergency Medicine

## 2017-07-06 DIAGNOSIS — N1330 Unspecified hydronephrosis: Secondary | ICD-10-CM

## 2017-07-06 DIAGNOSIS — N200 Calculus of kidney: Secondary | ICD-10-CM

## 2017-07-06 MED ORDER — CEPHALEXIN 500 MG PO CAPS: 500.0000 mg | ORAL_CAPSULE | Freq: Two times a day (BID) | ORAL | 0 refills | Status: AC

## 2017-07-06 MED ORDER — OXYCODONE-ACETAMINOPHEN 5-325 MG PO TABS: 1.0000 | ORAL_TABLET | ORAL | 0 refills | Status: DC | PRN

## 2017-07-06 MED ORDER — ONDANSETRON 4 MG PO TBDP: 4.0000 mg | ORAL_TABLET | Freq: Three times a day (TID) | ORAL | 0 refills | Status: DC | PRN

## 2017-07-06 MED ORDER — ONDANSETRON HCL 4 MG/2ML IJ SOLN
4.0000 mg | Freq: Once | INTRAMUSCULAR | Status: AC
  Administered 2017-07-06: 4 mg via INTRAVENOUS

## 2017-07-06 MED ORDER — MORPHINE SULFATE (PF) 4 MG/ML IV SOLN
4.0000 mg | Freq: Once | INTRAVENOUS | Status: AC
  Administered 2017-07-06: 4 mg via INTRAVENOUS

## 2017-07-06 MED ORDER — MORPHINE SULFATE (PF) 4 MG/ML IV SOLN
INTRAVENOUS | Status: AC
  Administered 2017-07-06: 4 mg via INTRAVENOUS
  Filled 2017-07-06: qty 1

## 2017-07-06 MED ORDER — ONDANSETRON HCL 4 MG/2ML IJ SOLN
INTRAMUSCULAR | Status: AC
  Administered 2017-07-06: 4 mg via INTRAVENOUS
  Filled 2017-07-06: qty 2

## 2017-07-06 NOTE — ED Notes (Signed)
Patient is asleep on the stretcher. Rise and fall of the chest noted. Patient is easily aroused when spoken to. Patient does not appear to be in acute distress at this time.   

## 2017-07-06 NOTE — ED Notes (Signed)

## 2017-07-06 NOTE — ED Provider Notes (Signed)
[facility] Emergency Department Provider Note   First MD Initiated Contact with Patient 07/06/17 0207     (approximate)  I have reviewed the triage vital signs and the nursing notes.   HISTORY  Chief Complaint Flank Pain   HPI Kristin Copeland is a 51 y.o. female with below list of chronic medical conditions including kidney stones and gastric bypass surgery presents with acute onset of 10 out of 10 right flank pain that started suddenly tonight.patient admits to urinary urgency with very little output as well as urinary frequency. Patient denies any dysuria or hematuria. Patient denies any fever. Patient admits to nausea no diarrhea or vomiting   Past Medical History:  Diagnosis Date  . ADHD   . Depression   . GERD (gastroesophageal reflux disease)    improved with gastric bypass  . Headache    migraines, improved with gastric bypass. now rare  . Hypertension    improved with Gastric Bypass. hopes to DC meds soon  . Kidney stones     Patient Active Problem List   Diagnosis Date Noted  . Obesity (BMI 30-39.9) 08/08/2016  . OSA on CPAP 08/08/2016  . Chronic low back pain without sciatica 08/08/2016  . Essential hypertension 08/08/2016  . Overflow stress urinary incontinence in female 08/08/2016  . Hiatal hernia 08/08/2016  . Hypercholesteremia 08/08/2016  . S/P gastric bypass 08/08/2016    Past Surgical History:  Procedure Laterality Date  . ABDOMINAL HYSTERECTOMY  2000  . HALLUX VALGUS AUSTIN Right 04/04/2017   Procedure: Austin hallux valgus correction right foot;  Surgeon: Gwyneth Revels, DPM;  Location: [facility];  Service: Podiatry;  Laterality: Right;  Latex sensitivity  . HAND SURGERY    . LAPAROSCOPIC ROUX-EN-Y GASTRIC BYPASS WITH HIATAL HERNIA REPAIR N/A 08/08/2016   Procedure: LAPAROSCOPIC ROUX-EN-Y GASTRIC BYPASS WITH HIATAL HERNIA REPAIR, UPPER ENDO;  Surgeon: Gaynelle Adu, MD;  Location: [facility];  Service: General;   Laterality: N/A;    Prior to Admission medications   Medication Sig Start Date End Date Taking? Authorizing Provider  buPROPion (WELLBUTRIN) 75 MG tablet Take 2 tablets (150 mg total) by mouth 3 (three) times daily. Patient taking differently: Take 100 mg by mouth 3 (three) times daily.  08/10/16   Gaynelle Adu, MD  Calcium 250 MG CAPS Take by mouth 2 (two) times daily.    [provider]  HYDROcodone-acetaminophen (NORCO) 5-325 MG tablet Take 1 tablet by mouth every 6 (six) hours as needed for moderate pain. 04/04/17   Gwyneth Revels, DPM  linaclotide (LINZESS) 72 MCG capsule Take 72 mcg by mouth daily before breakfast.    [provider]  methylphenidate (RITALIN) 5 MG tablet Take 1 tablet (5 mg total) by mouth 2 (two) times daily. Patient taking differently: Take 10 mg by mouth 2 (two) times daily.  08/10/16   Gaynelle Adu, MD  Multiple Vitamin (MULTI-VITAMINS) TABS Take 1 tablet by mouth daily.     [provider]  propranolol (INDERAL) 20 MG tablet Take 1.5 tablets (30 mg total) by mouth 2 (two) times daily. Patient taking differently: Take 40 mg by mouth 2 (two) times daily.  08/10/16   Gaynelle Adu, MD  topiramate (TOPAMAX) 25 MG tablet TAKE 3 TABLETS (75 MG TOTAL) BY MOUTH 2 (TWO) TIMES DAILY. 01/27/15   [provider]    Allergies Shellfish allergy; Nsaids; and Latex  Family History  Problem Relation Age of Onset  . Diabetes Mother   . Hypertension Mother   .  Cancer Mother   . Cancer Sister   . Cystic fibrosis Maternal Grandmother   . Hypertension Maternal Grandmother   . Kidney disease Maternal Grandmother     Social History Social History  Substance Use Topics  . Smoking status: Never Smoker  . Smokeless tobacco: Never Used  . Alcohol use Yes     Comment: occ - Holidays    Review of Systems Constitutional: No fever/chills Eyes: No visual changes. ENT: No sore throat. Cardiovascular: Denies chest pain. Respiratory: Denies  shortness of breath. Gastrointestinal:  No diarrhea.  No constipation.positive for right flank pain and nausea Genitourinary: Negative for dysuria.positive for urinary urgency and frequency Musculoskeletal: Negative for neck pain.  Negative for back pain. Integumentary: Negative for rash. Neurological: Negative for headaches, focal weakness or numbness.  ____________________________________________   PHYSICAL EXAM:  VITAL SIGNS: ED Triage Vitals  Enc Vitals Group     BP 07/05/17 2254 97/69     Pulse Rate 07/05/17 2254 63     Resp 07/05/17 2254 16     Temp 07/05/17 2254 97.6 F (36.4 C)     Temp Source 07/05/17 2254 Oral     SpO2 07/05/17 2254 99 %     Weight 07/05/17 2255 53.5 kg (118 lb)     Height 07/05/17 2255 1.524 m (5')     Head Circumference --      Peak Flow --      Pain Score 07/05/17 2254 10     Pain Loc --      Pain Edu? --      Excl. in GC? --     Constitutional: Alert and oriented. apparent discomfort Eyes: Conjunctivae are normal.  Head: Atraumatic. Mouth/Throat: Mucous membranes are moist.  Oropharynx non-erythematous. Neck: No stridor.  Cardiovascular: Normal rate, regular rhythm. Good peripheral circulation. Grossly normal heart sounds. Respiratory: Normal respiratory effort.  No retractions. Lungs CTAB. Gastrointestinal: Soft and nontender. No distention.  Musculoskeletal: No lower extremity tenderness nor edema. No gross deformities of extremities. Neurologic:  Normal speech and language. No gross focal neurologic deficits are appreciated.  Skin:  Skin is warm, dry and intact. No rash noted. Psychiatric: Mood and affect are normal. Speech and behavior are normal.  ____________________________________________   LABS (all labs ordered are listed, but only abnormal results are displayed)  Labs Reviewed  URINALYSIS, COMPLETE (UACMP) WITH MICROSCOPIC - Abnormal; Notable for the following:       Result Value   Color, Urine YELLOW (*)    APPearance  HAZY (*)    Bilirubin Urine SMALL (*)    Ketones, ur 20 (*)    Leukocytes, UA SMALL (*)    Squamous Epithelial / LPF 6-30 (*)    All other components within normal limits  BASIC METABOLIC PANEL - Abnormal; Notable for the following:    Glucose, Bld 113 (*)    All other components within normal limits  CBC    RADIOLOGY I, [place] N BROWN, personally viewed and evaluated these images (plain radiographs) as part of my medical decision making, as well as reviewing the written report by the radiologist.  Ct Renal Stone Study  Result Date: 07/06/2017 CLINICAL DATA:  Acute onset RIGHT flank pain today. History of kidney stones with similar symptoms. Urinary urgency. History of hysterectomy, gastric bypass surgery, kidney stones. EXAM: CT ABDOMEN AND PELVIS WITHOUT CONTRAST TECHNIQUE: Multidetector CT imaging of the abdomen and pelvis was performed following the standard protocol without IV contrast. COMPARISON:  CT abdomen and pelvis October 14, 2015 FINDINGS: LOWER CHEST: Stable 4 mm sub solid RIGHT lower lobe pulmonary nodule. The visualized heart size is normal. No pericardial effusion. HEPATOBILIARY: Normal. PANCREAS: Normal. SPLEEN: Normal. ADRENALS/URINARY TRACT: Kidneys are orthotopic, demonstrating normal size and morphology. Punctate RIGHT nephrolithiasis. Mild RIGHT hydroureteronephrosis. No LEFT obstructive uropathy. Limited assessment for renal masses on this nonenhanced examination. The unopacified ureters are normal in course and caliber. Urinary bladder is partially distended and unremarkable. Normal adrenal glands. STOMACH/BOWEL: Status post gastrojejunostomy. Mild amount of retained large bowel stool. No bowel obstruction. VASCULAR/LYMPHATIC: Aortoiliac vessels are normal in course and caliber. Mild calcific atherosclerosis. No lymphadenopathy by CT size criteria. REPRODUCTIVE: Status post hysterectomy. OTHER: No intraperitoneal free fluid or free air. MUSCULOSKELETAL: Non-acute.  Moderate to severe L5-S1 disc height loss and endplate spurring vacuum disc compatible with degenerative disc. Moderate RIGHT L5-S1 neural foraminal narrowing. IMPRESSION: 1. Mild RIGHT hydroureteronephrosis. Findings seen with urinary tract infection, recently passed stone or ureterovesicular junction stricture. 2. Punctate RIGHT nephrolithiasis. Aortic Atherosclerosis (ICD10-I70.0). Electronically Signed   By: Awilda Metroourtnay  Bloomer M.D.   On: 07/06/2017 02:44     Procedures   ____________________________________________   INITIAL IMPRESSION / ASSESSMENT AND PLAN / ED COURSE  As part of my medical decision making, I reviewed the following data within the electronic MEDICAL RECORD [id number] year old female presenting with history of physical exam concerning for possible ureterolithiasis versus pyelonephritis/urinary tract infection. Urinalysis revealed WBC 6-30 calcium oxalate crystals no bacteria. CT scan revealed right hydroureter nephrosis with no identified stone. Suspect possible recently passed kidney stone as etiology for the patient's discomfort. Patient given IV morphine emergency department with resolution of pain. Patient be referred to urology for further outpatient evaluation     ____________________________________________  FINAL CLINICAL IMPRESSION(S) / ED DIAGNOSES  Final diagnoses:  Kidney stone  Hydronephrosis of right kidney     MEDICATIONS GIVEN DURING THIS VISIT:  Medications  morphine 4 MG/ML injection 4 mg (4 mg Intravenous Given 07/06/17 0222)  ondansetron (ZOFRAN) injection 4 mg (4 mg Intravenous Given 07/06/17 0221)     NEW OUTPATIENT MEDICATIONS STARTED DURING THIS VISIT:  New Prescriptions   No medications on file    Modified Medications   No medications on file    Discontinued Medications   No medications on file     Note:  This document was prepared using Dragon voice recognition software and may include unintentional dictation errors.    Darci CurrentBrown,  Lostine N, MD 07/06/17 [phone]

## 2017-07-27 ENCOUNTER — Other Ambulatory Visit: Payer: Self-pay | Admitting: Family Medicine

## 2017-07-27 DIAGNOSIS — Z1239 Encounter for other screening for malignant neoplasm of breast: Secondary | ICD-10-CM

## 2018-02-27 ENCOUNTER — Emergency Department
Admission: EM | Admit: 2018-02-27 | Discharge: 2018-02-27 | Disposition: A | Discharge status: Home / Self Care | Payer: BLUE CROSS/BLUE SHIELD | Attending: Student in an Organized Health Care Education/Training Program | Admitting: Student in an Organized Health Care Education/Training Program

## 2018-02-27 ENCOUNTER — Other Ambulatory Visit: Payer: Self-pay

## 2018-02-27 ENCOUNTER — Emergency Department: Payer: BLUE CROSS/BLUE SHIELD

## 2018-02-27 DIAGNOSIS — Z79899 Other long term (current) drug therapy: Secondary | ICD-10-CM | POA: Insufficient documentation

## 2018-02-27 DIAGNOSIS — R109 Unspecified abdominal pain: Secondary | ICD-10-CM

## 2018-02-27 DIAGNOSIS — N1330 Unspecified hydronephrosis: Secondary | ICD-10-CM | POA: Insufficient documentation

## 2018-02-27 DIAGNOSIS — Z91040 Latex allergy status: Secondary | ICD-10-CM | POA: Diagnosis not present

## 2018-02-27 DIAGNOSIS — I10 Essential (primary) hypertension: Secondary | ICD-10-CM | POA: Diagnosis not present

## 2018-02-27 DIAGNOSIS — R1012 Left upper quadrant pain: Secondary | ICD-10-CM | POA: Diagnosis present

## 2018-02-27 LAB — URINALYSIS, COMPLETE (UACMP) WITH MICROSCOPIC
BILIRUBIN URINE: NEGATIVE
GLUCOSE, UA: NEGATIVE mg/dL
Ketones, ur: 5 mg/dL — AB
LEUKOCYTES UA: NEGATIVE
NITRITE: NEGATIVE
PH: 7 (ref 5.0–8.0)
Protein, ur: 100 mg/dL — AB
SPECIFIC GRAVITY, URINE: 1.019 (ref 1.005–1.030)

## 2018-02-27 LAB — COMPREHENSIVE METABOLIC PANEL
ALT: 19 U/L (ref 14–54)
AST: 17 U/L (ref 15–41)
Albumin: 4.1 g/dL (ref 3.5–5.0)
Alkaline Phosphatase: 67 U/L (ref 38–126)
Anion gap: 8 (ref 5–15)
BUN: 14 mg/dL (ref 6–20)
CHLORIDE: 109 mmol/L (ref 101–111)
CO2: 21 mmol/L — ABNORMAL LOW (ref 22–32)
CREATININE: 0.95 mg/dL (ref 0.44–1.00)
Calcium: 9.3 mg/dL (ref 8.9–10.3)
GFR calc Af Amer: >60 mL/min (ref >60)
Glucose, Bld: 98 mg/dL (ref 65–99)
POTASSIUM: 3.8 mmol/L (ref 3.5–5.1)
Sodium: 138 mmol/L (ref 135–145)
TOTAL PROTEIN: 6.6 g/dL (ref 6.5–8.1)
Total Bilirubin: 0.7 mg/dL (ref 0.3–1.2)

## 2018-02-27 LAB — CBC
HCT: 35.5 % (ref 35.0–47.0)
Hemoglobin: 12.1 g/dL (ref 12.0–16.0)
MCH: 31.5 pg (ref 26.0–34.0)
MCHC: 34.2 g/dL (ref 32.0–36.0)
MCV: 92.3 fL (ref 80.0–100.0)
PLATELETS: 309 10*3/uL (ref 150–440)
RBC: 3.84 MIL/uL (ref 3.80–5.20)
RDW: 13.5 % (ref 11.5–14.5)
WBC: 4.6 10*3/uL (ref 3.6–11.0)

## 2018-02-27 LAB — LIPASE, BLOOD: LIPASE: 29 U/L (ref 11–51)

## 2018-02-27 LAB — POCT PREGNANCY, URINE: PREG TEST UR: NEGATIVE

## 2018-02-27 MED ORDER — ONDANSETRON HCL 4 MG PO TABS: 4.0000 mg | ORAL_TABLET | Freq: Every day | ORAL | 0 refills | Status: AC | PRN

## 2018-02-27 MED ORDER — KETOROLAC TROMETHAMINE 30 MG/ML IJ SOLN
15.0000 mg | Freq: Once | INTRAMUSCULAR | Status: AC
  Administered 2018-02-27: 15 mg via INTRAVENOUS
  Filled 2018-02-27: qty 1

## 2018-02-27 MED ORDER — HYDROCODONE-ACETAMINOPHEN 5-325 MG PO TABS: 1.0000 | ORAL_TABLET | ORAL | 0 refills | Status: DC | PRN

## 2018-02-27 MED ORDER — TAMSULOSIN HCL 0.4 MG PO CAPS: 0.4000 mg | ORAL_CAPSULE | Freq: Every day | ORAL | 0 refills | Status: DC

## 2018-02-27 MED ORDER — ONDANSETRON HCL 4 MG/2ML IJ SOLN
4.0000 mg | Freq: Once | INTRAMUSCULAR | Status: AC | PRN
  Administered 2018-02-27: 4 mg via INTRAVENOUS
  Filled 2018-02-27: qty 2

## 2018-02-27 MED ORDER — PROMETHAZINE HCL 25 MG/ML IJ SOLN
12.5000 mg | Freq: Four times a day (QID) | INTRAMUSCULAR | Status: DC | PRN
Start: 2018-02-27 — End: 2018-02-28
  Administered 2018-02-27: 12.5 mg via INTRAVENOUS
  Filled 2018-02-27: qty 1

## 2018-02-27 MED ORDER — MORPHINE SULFATE (PF) 4 MG/ML IV SOLN
4.0000 mg | INTRAVENOUS | Status: DC | PRN
  Administered 2018-02-27: 4 mg via INTRAVENOUS
  Filled 2018-02-27: qty 1

## 2018-02-27 MED ORDER — SODIUM CHLORIDE 0.9 % IV BOLUS
1000.0000 mL | Freq: Once | INTRAVENOUS | Status: AC
  Administered 2018-02-27: 1000 mL via INTRAVENOUS

## 2018-02-27 NOTE — Discharge Instructions (Signed)
Follow-up with Dr. Apolinar JunesBrandon of urology.  Please return immediately if your symptoms worsen.  This would include fever, intractable nausea or vomiting, inability to control your pain with the pain medications we prescribed or for any additional questions or concerns.

## 2018-02-27 NOTE — ED Provider Notes (Signed)
[facility] Emergency Department Provider Note    First MD Initiated Contact with Patient 02/27/18 1830     (approximate)  I have reviewed the triage vital signs and the nursing notes.   HISTORY  Chief Complaint Flank Pain and Abdominal Pain    HPI Kristin Copeland is a 52 y.o. female with a h/o renal stones previously requiring stents presents to the ER with back pain and left flank pain similar to previous episodes of kidney stones.  Patient was shopping for dressed with her daughter today when she started having pain roughly 3 hours prior to arrival.  Describes it is mild to moderate in severity.  Has had associated nausea.  No measured fevers.  No dysuria.  No recent antibiotics.  She is status post gastric bypass.    Past Medical History:  Diagnosis Date  . ADHD   . Depression   . GERD (gastroesophageal reflux disease)    improved with gastric bypass  . Headache    migraines, improved with gastric bypass. now rare  . Hypertension    improved with Gastric Bypass. hopes to DC meds soon  . Kidney stones    Family History  Problem Relation Age of Onset  . Diabetes Mother   . Hypertension Mother   . Cancer Mother   . Cancer Sister   . Cystic fibrosis Maternal Grandmother   . Hypertension Maternal Grandmother   . Kidney disease Maternal Grandmother    Past Surgical History:  Procedure Laterality Date  . ABDOMINAL HYSTERECTOMY  2000  . HALLUX VALGUS AUSTIN Right 04/04/2017   Procedure: [place] hallux valgus correction right foot;  Surgeon: Gwyneth Revels, DPM;  Location: [facility];  Service: Podiatry;  Laterality: Right;  Latex sensitivity  . HAND SURGERY    . LAPAROSCOPIC ROUX-EN-Y GASTRIC BYPASS WITH HIATAL HERNIA REPAIR N/A 08/08/2016   Procedure: LAPAROSCOPIC ROUX-EN-Y GASTRIC BYPASS WITH HIATAL HERNIA REPAIR, UPPER ENDO;  Surgeon: Gaynelle Adu, MD;  Location: [facility];  Service: General;  Laterality: N/A;   Patient Active Problem List     Diagnosis Date Noted  . Obesity (BMI 30-39.9) 08/08/2016  . OSA on CPAP 08/08/2016  . Chronic low back pain without sciatica 08/08/2016  . Essential hypertension 08/08/2016  . Overflow stress urinary incontinence in female 08/08/2016  . Hiatal hernia 08/08/2016  . Hypercholesteremia 08/08/2016  . S/P gastric bypass 08/08/2016      Prior to Admission medications   Medication Sig Start Date End Date Taking? Authorizing Provider  buPROPion (WELLBUTRIN) 75 MG tablet Take 2 tablets (150 mg total) by mouth 3 (three) times daily. Patient taking differently: Take 100 mg by mouth 3 (three) times daily.  08/10/16   Gaynelle Adu, MD  Calcium 250 MG CAPS Take by mouth 2 (two) times daily.    [provider]  HYDROcodone-acetaminophen (NORCO) 5-325 MG tablet Take 1 tablet by mouth every 6 (six) hours as needed for moderate pain. 04/04/17   Gwyneth Revels, DPM  HYDROcodone-acetaminophen (NORCO) 5-325 MG tablet Take 1 tablet by mouth every 4 (four) hours as needed for moderate pain. 02/27/18   Willy Eddy, MD  linaclotide [facility]) 72 MCG capsule Take 72 mcg by mouth daily before breakfast.    [provider]  methylphenidate (RITALIN) 5 MG tablet Take 1 tablet (5 mg total) by mouth 2 (two) times daily. Patient taking differently: Take 10 mg by mouth 2 (two) times daily.  08/10/16   Gaynelle Adu, MD  Multiple Vitamin (MULTI-VITAMINS) TABS  Take 1 tablet by mouth daily.     [provider]  ondansetron (ZOFRAN ODT) 4 MG disintegrating tablet Take 1 tablet (4 mg total) by mouth every 8 (eight) hours as needed for nausea or vomiting. 07/06/17   Darci Current, MD  ondansetron (ZOFRAN) 4 MG tablet Take 1 tablet (4 mg total) by mouth daily as needed for nausea or vomiting. 02/27/18 02/27/19  Willy Eddy, MD  oxyCODONE-acetaminophen (ROXICET) 5-325 MG tablet Take 1 tablet by mouth every 4 (four) hours as needed for severe pain. 07/06/17   Darci Current, MD   propranolol (INDERAL) 20 MG tablet Take 1.5 tablets (30 mg total) by mouth 2 (two) times daily. Patient taking differently: Take 40 mg by mouth 2 (two) times daily.  08/10/16   Gaynelle Adu, MD  tamsulosin (FLOMAX) 0.4 MG CAPS capsule Take 1 capsule (0.4 mg total) by mouth daily after supper. 02/27/18   Willy Eddy, MD  topiramate (TOPAMAX) 25 MG tablet TAKE 3 TABLETS (75 MG TOTAL) BY MOUTH 2 (TWO) TIMES DAILY. 01/27/15   [provider]    Allergies Shellfish allergy; Nsaids; and Latex    Social History Social History   Tobacco Use  . Smoking status: Never Smoker  . Smokeless tobacco: Never Used  Substance Use Topics  . Alcohol use: Yes    Comment: occ - Holidays  . Drug use: No    Review of Systems Patient denies headaches, rhinorrhea, blurry vision, numbness, shortness of breath, chest pain, edema, cough, abdominal pain, nausea, vomiting, diarrhea, dysuria, fevers, rashes or hallucinations unless otherwise stated above in HPI. ____________________________________________   PHYSICAL EXAM:  VITAL SIGNS: Vitals:   02/27/18 1804  BP: 99/65  Pulse: 63  Resp: 18  Temp: 98.2 F (36.8 C)  SpO2: 100%    Constitutional: Alert and oriented.  Eyes: Conjunctivae are normal.  Head: Atraumatic. Nose: No congestion/rhinnorhea. Mouth/Throat: Mucous membranes are moist.   Neck: No stridor. Painless ROM.  Cardiovascular: Normal rate, regular rhythm. Grossly normal heart sounds.  Good peripheral circulation. Respiratory: Normal respiratory effort.  No retractions. Lungs CTAB. Gastrointestinal: Soft and nontender. No distention. No abdominal bruits. + left CVA tenderness. Genitourinary: deferred Musculoskeletal: No lower extremity tenderness nor edema.  No joint effusions. Neurologic:  Normal speech and language. No gross focal neurologic deficits are appreciated. No facial droop Skin:  Skin is warm, dry and intact. No rash noted. Psychiatric: Mood and affect are  normal. Speech and behavior are normal.  ____________________________________________   LABS (all labs ordered are listed, but only abnormal results are displayed)  Results for orders placed or performed during the hospital encounter of 02/27/18 (from the past 24 hour(s))  Lipase, blood     Status: None   Collection Time: 02/27/18  6:06 PM  Result Value Ref Range   Lipase 29 11 - 51 U/L  Comprehensive metabolic panel     Status: Abnormal   Collection Time: 02/27/18  6:06 PM  Result Value Ref Range   Sodium 138 135 - 145 mmol/L   Potassium 3.8 3.5 - 5.1 mmol/L   Chloride 109 101 - 111 mmol/L   CO2 21 (L) 22 - 32 mmol/L   Glucose, Bld 98 65 - 99 mg/dL   BUN 14 6 - 20 mg/dL   Creatinine, Ser 1.61 0.44 - 1.00 mg/dL   Calcium 9.3 8.9 - 09.6 mg/dL   Total Protein 6.6 6.5 - 8.1 g/dL   Albumin 4.1 3.5 - 5.0 g/dL   AST 17 15 -  41 U/L   ALT 19 14 - 54 U/L   Alkaline Phosphatase 67 38 - 126 U/L   Total Bilirubin 0.7 0.3 - 1.2 mg/dL   GFR calc non Af Amer >60 >60 mL/min   GFR calc Af Amer >60 >60 mL/min   Anion gap 8 5 - 15  CBC     Status: None   Collection Time: 02/27/18  6:06 PM  Result Value Ref Range   WBC 4.6 3.6 - 11.0 K/uL   RBC 3.84 3.80 - 5.20 MIL/uL   Hemoglobin 12.1 12.0 - 16.0 g/dL   HCT 16.1 09.6 - 04.5 %   MCV 92.3 80.0 - 100.0 fL   MCH 31.5 26.0 - 34.0 pg   MCHC 34.2 32.0 - 36.0 g/dL   RDW 40.9 81.1 - 91.4 %   Platelets 309 150 - 440 K/uL  Urinalysis, Complete w Microscopic     Status: Abnormal   Collection Time: 02/27/18  6:06 PM  Result Value Ref Range   Color, Urine YELLOW (A) YELLOW   APPearance HAZY (A) CLEAR   Specific Gravity, Urine 1.019 1.005 - 1.030   pH 7.0 5.0 - 8.0   Glucose, UA NEGATIVE NEGATIVE mg/dL   Hgb urine dipstick LARGE (A) NEGATIVE   Bilirubin Urine NEGATIVE NEGATIVE   Ketones, ur 5 (A) NEGATIVE mg/dL   Protein, ur 782 (A) NEGATIVE mg/dL   Nitrite NEGATIVE NEGATIVE   Leukocytes, UA NEGATIVE NEGATIVE   RBC / HPF >50 (H) 0 - 5 RBC/hpf    WBC, UA 6-10 0 - 5 WBC/hpf   Bacteria, UA RARE (A) NONE SEEN   Squamous Epithelial / LPF 0-5 0 - 5   Mucus PRESENT    Ca Oxalate Crys, UA PRESENT   Pregnancy, urine POC     Status: None   Collection Time: 02/27/18  6:17 PM  Result Value Ref Range   Preg Test, Ur NEGATIVE NEGATIVE   ____________________________________________   ____________________________________________  RADIOLOGY  I personally reviewed all radiographic images ordered to evaluate for the above acute complaints and reviewed radiology reports and findings.  These findings were personally discussed with the patient.  Please see medical record for radiology report.  ____________________________________________   PROCEDURES  Procedure(s) performed:  Procedures    Critical Care performed: no ____________________________________________   INITIAL IMPRESSION / ASSESSMENT AND PLAN / ED COURSE  Pertinent labs & imaging results that were available during my care of the patient were reviewed by me and considered in my medical decision making (see chart for details).   DDX: stone, pyelo, renal colic, uti, colitis, cystitis  AZAIAH MELLO is a 52 y.o. who presents to the ED with his as described above.  Patient describes pain is identical to previous episodes of renal colic.  Has had complicated previous stones requiring stent and even episode of infected stone.  She is currently afebrile and hemodynamically stable but very uncomfortable appearing but states that his symptoms have gotten much better since getting back to the ER bed.  We will give IV fluids as well as IV pain medication IV antiemetics.  She has a history of similar symptoms will start with KUB and ultrasound.  If there is evidence of hydronephrosis and presentation consistent with stone do not feel that the emergent CT imaging clinically indicated given her known history.  Will reassess.  Clinical Course as of Feb 28 2215  Wed Feb 27, 2018  2159 Up  ambulating about with no distress.  Remains afebrile.  Does not seem to be clinically consistent with infected stone, given absence of leukocytosis or fever. More consistent with recurrent kidney stone is currently well controlled.  Will discharge with pain medication as well as nausea medication and tamsulosin.  Will be given referral to urology with strict return precautions.   [PR]    Clinical Course User Index [PR] Willy Eddyobinson, Ane Conerly, MD     As part of my medical decision making, I reviewed the following data within the electronic MEDICAL RECORD NUMBER Nursing notes reviewed and incorporated, Labs reviewed, notes from prior ED visits.   ____________________________________________   FINAL CLINICAL IMPRESSION(S) / ED DIAGNOSES  Final diagnoses:  Left flank pain  Hydronephrosis, unspecified hydronephrosis type      NEW MEDICATIONS STARTED DURING THIS VISIT:  New Prescriptions   HYDROCODONE-ACETAMINOPHEN (NORCO) 5-325 MG TABLET    Take 1 tablet by mouth every 4 (four) hours as needed for moderate pain.   ONDANSETRON (ZOFRAN) 4 MG TABLET    Take 1 tablet (4 mg total) by mouth daily as needed for nausea or vomiting.   TAMSULOSIN (FLOMAX) 0.4 MG CAPS CAPSULE    Take 1 capsule (0.4 mg total) by mouth daily after supper.     Note:  This document was prepared using Dragon voice recognition software and may include unintentional dictation errors.    Willy Eddyobinson, Chamaine Stankus, MD 02/27/18 2216

## 2018-02-27 NOTE — ED Notes (Signed)
Pt given applesauce and crackers. Family at bedside.

## 2018-02-27 NOTE — ED Notes (Signed)
EDP at bedside  

## 2018-02-27 NOTE — ED Notes (Signed)
Patient transported to Ultrasound 

## 2018-02-27 NOTE — ED Notes (Signed)
Pt up and ambulatory to bathroom at this time. She states that she is a little light headed. Accompanied pt to bathroom and back. She tolerated well. She was also able to drink and keep down some crackers.

## 2018-02-27 NOTE — ED Notes (Signed)
Pt given some ice water to sip on at this time.

## 2018-02-27 NOTE — ED Triage Notes (Signed)
LLQ abdominal and left flank pain. Hx of kidney stones. Pt pale. Nausea.

## 2018-03-01 LAB — URINE CULTURE

## 2019-02-14 ENCOUNTER — Ambulatory Visit: Payer: Self-pay | Admitting: Urology

## 2019-06-05 ENCOUNTER — Encounter (HOSPITAL_COMMUNITY): Payer: Self-pay

## 2019-12-31 ENCOUNTER — Other Ambulatory Visit: Payer: Self-pay | Admitting: Family Medicine

## 2019-12-31 DIAGNOSIS — Z1231 Encounter for screening mammogram for malignant neoplasm of breast: Secondary | ICD-10-CM

## 2020-03-03 ENCOUNTER — Encounter (HOSPITAL_COMMUNITY): Payer: Self-pay

## 2021-10-11 ENCOUNTER — Other Ambulatory Visit: Payer: Self-pay

## 2021-10-11 ENCOUNTER — Emergency Department
Admission: EM | Admit: 2021-10-11 | Discharge: 2021-10-11 | Disposition: A | Discharge status: Home / Self Care | Payer: BC Managed Care – PPO | Attending: Emergency Medicine | Admitting: Emergency Medicine

## 2021-10-11 ENCOUNTER — Emergency Department: Payer: BC Managed Care – PPO

## 2021-10-11 DIAGNOSIS — N200 Calculus of kidney: Secondary | ICD-10-CM

## 2021-10-11 DIAGNOSIS — R109 Unspecified abdominal pain: Secondary | ICD-10-CM | POA: Diagnosis present

## 2021-10-11 LAB — BASIC METABOLIC PANEL
Anion gap: 4 — ABNORMAL LOW (ref 5–15)
BUN: 20 mg/dL (ref 6–20)
CO2: 25 mmol/L (ref 22–32)
Calcium: 9.3 mg/dL (ref 8.9–10.3)
Chloride: 106 mmol/L (ref 98–111)
Creatinine, Ser: 0.93 mg/dL (ref 0.44–1.00)
GFR, Estimated: >60 mL/min (ref >60)
Glucose, Bld: 88 mg/dL (ref 70–99)
Potassium: 3.8 mmol/L (ref 3.5–5.1)
Sodium: 135 mmol/L (ref 135–145)

## 2021-10-11 LAB — CBC
HCT: 39.4 % (ref 36.0–46.0)
Hemoglobin: 12.6 g/dL (ref 12.0–15.0)
MCH: 30.4 pg (ref 26.0–34.0)
MCHC: 32 g/dL (ref 30.0–36.0)
MCV: 94.9 fL (ref 80.0–100.0)
Platelets: 364 10*3/uL (ref 150–400)
RBC: 4.15 MIL/uL (ref 3.87–5.11)
RDW: 12 % (ref 11.5–15.5)
WBC: 4.3 10*3/uL (ref 4.0–10.5)
nRBC: 0 % (ref 0.0–0.2)

## 2021-10-11 LAB — URINALYSIS, MICROSCOPIC (REFLEX)
Bacteria, UA: NONE SEEN
RBC / HPF: >50 RBC/hpf (ref 0–5)
WBC, UA: >50 WBC/hpf (ref 0–5)

## 2021-10-11 LAB — URINALYSIS, ROUTINE W REFLEX MICROSCOPIC
Bilirubin Urine: NEGATIVE
Glucose, UA: NEGATIVE mg/dL
Ketones, ur: NEGATIVE mg/dL
Nitrite: NEGATIVE
Protein, ur: >300 mg/dL — AB
Specific Gravity, Urine: 1.025 (ref 1.005–1.030)
pH: 5.5 (ref 5.0–8.0)

## 2021-10-11 MED ORDER — OXYCODONE-ACETAMINOPHEN 5-325 MG PO TABS: 1.0000 | ORAL_TABLET | Freq: Four times a day (QID) | ORAL | 0 refills | Status: DC | PRN

## 2021-10-11 MED ORDER — MORPHINE SULFATE (PF) 4 MG/ML IV SOLN
4.0000 mg | Freq: Once | INTRAVENOUS | Status: AC
  Administered 2021-10-11: 4 mg via INTRAVENOUS
  Filled 2021-10-11: qty 1

## 2021-10-11 MED ORDER — ONDANSETRON HCL 4 MG/2ML IJ SOLN
4.0000 mg | Freq: Once | INTRAMUSCULAR | Status: AC
  Administered 2021-10-11: 4 mg via INTRAVENOUS
  Filled 2021-10-11: qty 2

## 2021-10-11 MED ORDER — SODIUM CHLORIDE 0.9 % IV SOLN
1000.0000 mL | Freq: Once | INTRAVENOUS | Status: AC
  Administered 2021-10-11: 1000 mL via INTRAVENOUS

## 2021-10-11 NOTE — ED Provider Notes (Signed)
[facility] Provider Note    Event Date/Time   First MD Initiated Contact with Patient 10/11/21 1113     (approximate)   History   Flank Pain   HPI  Kristin Copeland is a 56 y.o. female who presents with complaints of right flank pain for nearly 3 weeks now.  Patient reports he has a history of kidney stones and this feels similar.  However her pain is not improving.  She occasionally has nausea as well.  Saw her PCP who wrote a prescription for pain medication and antibiotics but symptoms not improved.  No fevers or chills, no dysuria.     Physical Exam   Triage Vital Signs: ED Triage Vitals [10/11/21 1051]  Enc Vitals Group     BP (!) 129/93     Pulse Rate 77     Resp 16     Temp 98.4 F (36.9 C)     Temp Source Oral     SpO2 100 %     Weight 55.3 kg (122 lb)     Height 1.524 m (5')     Head Circumference      Peak Flow      Pain Score 3     Pain Loc      Pain Edu?      Excl. in GC?     Most recent vital signs: Vitals:   10/11/21 1051  BP: (!) 129/93  Pulse: 77  Resp: 16  Temp: 98.4 F (36.9 C)  SpO2: 100%     General: Awake, no distress.  CV:  Good peripheral perfusion.  Resp:  Normal effort.  Abd:  No distention.  Mild right CVA tenderness Other:     ED Results / Procedures / Treatments   Labs (all labs ordered are listed, but only abnormal results are displayed) Labs Reviewed  URINALYSIS, ROUTINE W REFLEX MICROSCOPIC - Abnormal; Notable for the following components:      Result Value   Hgb urine dipstick LARGE (*)    Protein, ur >300 (*)    Leukocytes,Ua SMALL (*)    All other components within normal limits  BASIC METABOLIC PANEL - Abnormal; Notable for the following components:   Anion gap 4 (*)    All other components within normal limits  CBC  URINALYSIS, MICROSCOPIC (REFLEX)     EKG     RADIOLOGY CT renal stone study reviewed by me, large proximal right ureteral stone    PROCEDURES:  Critical  Care performed:   Procedures   MEDICATIONS ORDERED IN ED: Medications  morphine 4 MG/ML injection 4 mg (4 mg Intravenous Given 10/11/21 1138)  ondansetron (ZOFRAN) injection 4 mg (4 mg Intravenous Given 10/11/21 1138)  0.9 %  sodium chloride infusion (1,000 mLs Intravenous New Bag/Given 10/11/21 1138)     IMPRESSION / MDM / ASSESSMENT AND PLAN / ED COURSE  I reviewed the triage vital signs and the nursing notes.   Patient presents with right flank pain as detailed above, history of kidney stones however her pain is not improving, given this we will send for CT imaging.  Lab work reviewed by me, BMP is unremarkable, CBC is normal, No evidence of UTI on urinalysis  CT scan demonstrates 1.3 cm stone proximal right ureter  Consulted and discussed with Dr. Lonna Cobb of urology who will see the patient in his office tomorrow to arrange for lithotripsy  Patient is feeling much better after IV morphine and IV Zofran  Appropriate  for d/c with outpatient follow up as arranged         FINAL CLINICAL IMPRESSION(S) / ED DIAGNOSES   Final diagnoses:  Kidney stone     Rx / DC Orders   ED Discharge Orders          Ordered    oxyCODONE-acetaminophen (PERCOCET) 5-325 MG tablet  Every 6 hours PRN        10/11/21 1159             Note:  This document was prepared using Dragon voice recognition software and may include unintentional dictation errors.   Jene Every, MD 10/11/21 1200

## 2021-10-11 NOTE — ED Triage Notes (Signed)
Pt sent from [facility] with c/o right flank pain for the past 3 weeks, states she has had one course of abx, states her sx are not improving. States she has a hx of kidney stones with renal stent in the right side before. States initially she had nausea with the pain.

## 2021-10-12 ENCOUNTER — Other Ambulatory Visit: Payer: Self-pay | Admitting: Urology

## 2021-10-12 ENCOUNTER — Encounter: Payer: Self-pay | Admitting: Urology

## 2021-10-12 ENCOUNTER — Ambulatory Visit: Payer: BC Managed Care – PPO | Admitting: Urology

## 2021-10-12 VITALS — BP 119/86 | HR 86 | Ht 60.0 in | Wt 123.0 lb

## 2021-10-12 DIAGNOSIS — N23 Unspecified renal colic: Secondary | ICD-10-CM | POA: Diagnosis not present

## 2021-10-12 DIAGNOSIS — N200 Calculus of kidney: Secondary | ICD-10-CM

## 2021-10-12 DIAGNOSIS — N132 Hydronephrosis with renal and ureteral calculous obstruction: Secondary | ICD-10-CM

## 2021-10-12 NOTE — Progress Notes (Signed)
ESWL ORDER FORM  Expected date of procedure: 10/13/2021  Surgeon: Hollice Espy, MD  Post op standing: 2-4wk follow up w/KUB prior  Anticoagulation/Aspirin/NSAID standing order: Hold all 72 hours prior  Anesthesia standing order: MAC  VTE standing: SCD's  Dx: Right Nephrolihtiasis  Procedure: right Extracorporeal shock wave lithotripsy  CPT : 86754  Standing Order Set:   *NPO after mn, KUB  *NS 182m/hr, Keflex 5027mPO, Benadryl 2565mO, Valium 69m53m, Zofran 4mg 14m   Medications if other than standing orders:   NONE

## 2021-10-12 NOTE — H&P (View-Only) (Signed)
10/12/2021 1:33 PM   [name] 1965/09/21 [id number]  Referring provider: Dorothey Baseman, MD [phone] [place],  [place] [address]  Chief Complaint  Patient presents with   Nephrolithiasis    HPI: Kristin Copeland is a 56 y.o. female who presents in follow-up of a recent ED visit for renal colic.  Presented to [facility] ED 10/11/2021 with a 3-week history of intermittent right flank pain which was not improving Initially saw her PCP who prescribed pain medication and antibiotics without improvement in her symptoms Pain located right flank with severe intensity.  No identifiable precipitating, aggravating or alleviating factors UA with >50 RBC/WBC; 6-10 squamous epithelial cells No fever, chills, nausea or vomiting Urine culture 10/02/2021 with mixed flora Stone protocol CT with a 7 x 13 mm right renal pelvic calculus and mild hydronephrosis Pain controlled with parenteral analgesics and discharged on oxycodone and tamsulosin Since ED visit pain has been better Prior history recurrent stone disease.  Previous gastric bypass and taking Topamax Prior history of intraoperative ureteral injury during abdominal hysterectomy in 2000 and had an indwelling stent which she did not tolerate well   PMH: Past Medical History:  Diagnosis Date   ADHD    Depression    GERD (gastroesophageal reflux disease)    improved with gastric bypass   Headache    migraines, improved with gastric bypass. now rare   Hypertension    improved with Gastric Bypass. hopes to DC meds soon   Kidney stones     Surgical History: Past Surgical History:  Procedure Laterality Date   ABDOMINAL HYSTERECTOMY  2000   HALLUX VALGUS AUSTIN Right 04/04/2017   Procedure: Austin hallux valgus correction right foot;  Surgeon: Gwyneth Revels, DPM;  Location: [facility];  Service: Podiatry;  Laterality: Right;  Latex sensitivity   HAND SURGERY     LAPAROSCOPIC ROUX-EN-Y GASTRIC BYPASS WITH HIATAL HERNIA REPAIR N/A  08/08/2016   Procedure: LAPAROSCOPIC ROUX-EN-Y GASTRIC BYPASS WITH HIATAL HERNIA REPAIR, UPPER ENDO;  Surgeon: Gaynelle Adu, MD;  Location: [facility];  Service: General;  Laterality: N/A;    Home Medications:  Allergies as of 10/12/2021       Reactions   Shellfish Allergy Shortness Of Breath, Rash   Nsaids    Avoids due to gastric bypass   Latex Rash   Only when wearing gloves for extended time.   Tolmetin Other (See Comments)   Avoids due to gastric bypass- Pt. doesn't remember reaction        Medication List        Accurate as of October 12, 2021  1:33 PM. If you have any questions, ask your nurse or doctor.          buPROPion 75 MG tablet Commonly known as: WELLBUTRIN Take 2 tablets (150 mg total) by mouth 3 (three) times daily. What changed: how much to take   Calcium 250 MG Caps Take by mouth 2 (two) times daily.   HYDROcodone-acetaminophen 5-325 MG tablet Commonly known as: Norco Take 1 tablet by mouth every 6 (six) hours as needed for moderate pain.   HYDROcodone-acetaminophen 5-325 MG tablet Commonly known as: Norco Take 1 tablet by mouth every 4 (four) hours as needed for moderate pain.   Linzess 72 MCG capsule Generic drug: linaclotide Take 72 mcg by mouth daily before breakfast.   methylphenidate 5 MG tablet Commonly known as: Ritalin Take 1 tablet (5 mg total) by mouth 2 (two) times daily. What changed: how much to  take   Multi-Vitamins Tabs Take 1 tablet by mouth daily.   oxyCODONE-acetaminophen 5-325 MG tablet Commonly known as: Percocet Take 1 tablet by mouth every 6 (six) hours as needed for severe pain.   propranolol 20 MG tablet Commonly known as: INDERAL Take 1.5 tablets (30 mg total) by mouth 2 (two) times daily. What changed: how much to take   tamsulosin 0.4 MG Caps capsule Commonly known as: FLOMAX Take 1 capsule (0.4 mg total) by mouth daily after supper.   topiramate 25 MG tablet Commonly known as: TOPAMAX TAKE 3 TABLETS (75  MG TOTAL) BY MOUTH 2 (TWO) TIMES DAILY.        Allergies:  Allergies  Allergen Reactions   Shellfish Allergy Shortness Of Breath and Rash   Nsaids     Avoids due to gastric bypass   Latex Rash    Only when wearing gloves for extended time.   Tolmetin Other (See Comments)    Avoids due to gastric bypass- Pt. doesn't remember reaction    Family History: Family History  Problem Relation Age of Onset   Diabetes Mother    Hypertension Mother    Cancer Mother    Cancer Sister    Cystic fibrosis Maternal Grandmother    Hypertension Maternal Grandmother    Kidney disease Maternal Grandmother     Social History:  reports that she has never smoked. She has never used smokeless tobacco. She reports current alcohol use. She reports that she does not use drugs.   Physical Exam: BP 119/86    Pulse 86    Ht 5' (1.524 m)    Wt 123 lb (55.8 kg)    BMI 24.02 kg/m   Constitutional:  Alert and oriented, No acute distress. HEENT: Renovo AT, moist mucus membranes.  Trachea midline, no masses. Cardiovascular: No clubbing, cyanosis, or edema. Respiratory: Normal respiratory effort, no increased work of breathing. GI: Abdomen is soft, nontender, nondistended, no abdominal masses GU: No CVA tenderness Psychiatric: Normal mood and affect.  Laboratory Data:  Urinalysis Appearance-red Dipstick-nitrate negative/3+ blood/3+ protein/1+ leukocytes Microscopy 11-30 WBC/>30 RBC   Pertinent Imaging: CT images personally reviewed and interpreted.  Stone density 1275 HU; SSD 8 cm  CT Renal Stone Study  Narrative CLINICAL DATA:  Flank pain, kidney stone suspected; right flank pain  EXAM: CT ABDOMEN AND PELVIS WITHOUT CONTRAST  TECHNIQUE: Multidetector CT imaging of the abdomen and pelvis was performed following the standard protocol without IV contrast.  RADIATION DOSE REDUCTION: This exam was performed according to the departmental dose-optimization program which includes  automated exposure control, adjustment of the mA and/or kV according to patient size and/or use of iterative reconstruction technique.  COMPARISON:  2018  FINDINGS: Lower chest: No acute abnormality.  Hepatobiliary: No focal liver abnormality is seen. No gallstones, gallbladder wall thickening, or biliary dilatation.  Pancreas: Stable appearance with atrophy.  Spleen: Unremarkable.  Adrenals/Urinary Tract: Adrenals are unremarkable. There is a 1.3 x 0.7 cm obstructing calculus within the right renal pelvis. Infiltration of parapelvic fat. Mild right hydronephrosis. Bladder is poorly distended.  Stomach/Bowel: Evidence of prior gastric bypass. Bowel is normal in caliber. There is stool throughout the colon.  Vascular/Lymphatic: Minor atherosclerosis.  No enlarged nodes.  Reproductive: Status post hysterectomy. No adnexal masses.  Other: No acute abnormality of the abdominal wall.  Musculoskeletal: Lower lumbar degenerative changes.  IMPRESSION: 1.3 cm obstructing calculus within the right renal pelvis with mild hydronephrosis.   Electronically Signed By: Guadlupe Spanish M.D. On: 10/11/2021 11:37  Assessment & Plan:    1.  Right renal pelvic calculus We discussed various treatment options for urolithiasis including observation with or without medical expulsive therapy, shockwave lithotripsy (SWL), ureteroscopy and laser lithotripsy with stent placement, and percutaneous nephrolithotomy.  We discussed that management is based on stone size, location, density, patient co-morbidities, and patient preference.   Stones <1085mm in size have a >80% spontaneous passage rate. Data surrounding the use of tamsulosin for medical expulsive therapy is controversial, but meta analyses suggests it is most efficacious for distal stones between 5-4310mm in size. Possible side effects include dizziness/lightheadedness, and retrograde ejaculation.  SWL has a lower stone free rate in a single  procedure, but also a lower complication rate compared to ureteroscopy and avoids a stent and associated stent related symptoms. Possible complications include renal hematoma, steinstrasse, and need for additional treatment.  Ureteroscopy with laser lithotripsy and stent placement has a higher stone free rate than SWL in a single procedure, however increased complication rate including possible infection, ureteral injury, bleeding, and stent related morbidity. Common stent related symptoms include dysuria, urgency/frequency, and flank pain.  PCNL is the favored treatment for stones >2cm. It involves a small incision in the flank, with complete fragmentation of stones and removal. It has the highest stone free rate, but also the highest complication rate. Possible complications include bleeding, infection/sepsis, injury to surrounding organs including the pleura, and collecting system injury.   After an extensive discussion of the risks and benefits of the above treatment options, the patient would like to proceed with SWL.  We did discuss this is a large, dense stone and the possibility of incomplete fragmentation with the need for repeat ESWL or alternative procedure.  We also discussed possibility of renal colic secondary to Steinstrasse which could potentially require ureteroscopy with stent placement.  All questions were answered and she desires to proceed.   Riki AltesScott C Marquis Down, MD  [facility] [address], [address] [place], [place] [address] [phone]

## 2021-10-12 NOTE — Progress Notes (Signed)
10/12/2021 1:33 PM   Kristin Copeland 1965/09/21 [id number]  Referring provider: Dorothey Baseman, MD [phone] Kristin Copeland,  [place] [address]  Chief Complaint  Patient presents with   Nephrolithiasis    HPI: Kristin Copeland is a 56 y.o. female who presents in follow-up of a recent ED visit for renal colic.  Presented to [facility] ED 10/11/2021 with a 3-week history of intermittent right flank pain which was not improving Initially saw her PCP who prescribed pain medication and antibiotics without improvement in her symptoms Pain located right flank with severe intensity.  No identifiable precipitating, aggravating or alleviating factors UA with >50 RBC/WBC; 6-10 squamous epithelial cells No fever, chills, nausea or vomiting Urine culture 10/02/2021 with mixed flora Stone protocol CT with a 7 x 13 mm right renal pelvic calculus and mild hydronephrosis Pain controlled with parenteral analgesics and discharged on oxycodone and tamsulosin Since ED visit pain has been better Prior history recurrent stone disease.  Previous gastric bypass and taking Topamax Prior history of intraoperative ureteral injury during abdominal hysterectomy in 2000 and had an indwelling stent which she did not tolerate well   PMH: Past Medical History:  Diagnosis Date   ADHD    Depression    GERD (gastroesophageal reflux disease)    improved with gastric bypass   Headache    migraines, improved with gastric bypass. now rare   Hypertension    improved with Gastric Bypass. hopes to DC meds soon   Kidney stones     Surgical History: Past Surgical History:  Procedure Laterality Date   ABDOMINAL HYSTERECTOMY  2000   HALLUX VALGUS AUSTIN Right 04/04/2017   Procedure: [place] hallux valgus correction right foot;  Surgeon: Gwyneth Revels, DPM;  Location: [facility];  Service: Podiatry;  Laterality: Right;  Latex sensitivity   HAND SURGERY     LAPAROSCOPIC ROUX-EN-Y GASTRIC BYPASS WITH HIATAL HERNIA REPAIR N/A  08/08/2016   Procedure: LAPAROSCOPIC ROUX-EN-Y GASTRIC BYPASS WITH HIATAL HERNIA REPAIR, UPPER ENDO;  Surgeon: Gaynelle Adu, MD;  Location: [facility];  Service: General;  Laterality: N/A;    Home Medications:  Allergies as of 10/12/2021       Reactions   Shellfish Allergy Shortness Of Breath, Rash   Nsaids    Avoids due to gastric bypass   Latex Rash   Only when wearing gloves for extended time.   Tolmetin Other (See Comments)   Avoids due to gastric bypass- Pt. doesn't remember reaction        Medication List        Accurate as of October 12, 2021  1:33 PM. If you have any questions, ask your nurse or doctor.          buPROPion 75 MG tablet Commonly known as: WELLBUTRIN Take 2 tablets (150 mg total) by mouth 3 (three) times daily. What changed: how much to take   Calcium 250 MG Caps Take by mouth 2 (two) times daily.   HYDROcodone-acetaminophen 5-325 MG tablet Commonly known as: Norco Take 1 tablet by mouth every 6 (six) hours as needed for moderate pain.   HYDROcodone-acetaminophen 5-325 MG tablet Commonly known as: Norco Take 1 tablet by mouth every 4 (four) hours as needed for moderate pain.   Linzess 72 MCG capsule Generic drug: linaclotide Take 72 mcg by mouth daily before breakfast.   methylphenidate 5 MG tablet Commonly known as: Ritalin Take 1 tablet (5 mg total) by mouth 2 (two) times daily. What changed: how much to  take   Multi-Vitamins Tabs Take 1 tablet by mouth daily.   oxyCODONE-acetaminophen 5-325 MG tablet Commonly known as: Percocet Take 1 tablet by mouth every 6 (six) hours as needed for severe pain.   propranolol 20 MG tablet Commonly known as: INDERAL Take 1.5 tablets (30 mg total) by mouth 2 (two) times daily. What changed: how much to take   tamsulosin 0.4 MG Caps capsule Commonly known as: FLOMAX Take 1 capsule (0.4 mg total) by mouth daily after supper.   topiramate 25 MG tablet Commonly known as: TOPAMAX TAKE 3 TABLETS (75  MG TOTAL) BY MOUTH 2 (TWO) TIMES DAILY.        Allergies:  Allergies  Allergen Reactions   Shellfish Allergy Shortness Of Breath and Rash   Nsaids     Avoids due to gastric bypass   Latex Rash    Only when wearing gloves for extended time.   Tolmetin Other (See Comments)    Avoids due to gastric bypass- Pt. doesn't remember reaction    Family History: Family History  Problem Relation Age of Onset   Diabetes Mother    Hypertension Mother    Cancer Mother    Cancer Sister    Cystic fibrosis Maternal Grandmother    Hypertension Maternal Grandmother    Kidney disease Maternal Grandmother     Social History:  reports that she has never smoked. She has never used smokeless tobacco. She reports current alcohol use. She reports that she does not use drugs.   Physical Exam: BP 119/86    Pulse 86    Ht 5' (1.524 m)    Wt 123 lb (55.8 kg)    BMI 24.02 kg/m   Constitutional:  Alert and oriented, No acute distress. HEENT: Renovo AT, moist mucus membranes.  Trachea midline, no masses. Cardiovascular: No clubbing, cyanosis, or edema. Respiratory: Normal respiratory effort, no increased work of breathing. GI: Abdomen is soft, nontender, nondistended, no abdominal masses GU: No CVA tenderness Psychiatric: Normal mood and affect.  Laboratory Data:  Urinalysis Appearance-red Dipstick-nitrate negative/3+ blood/3+ protein/1+ leukocytes Microscopy 11-30 WBC/>30 RBC   Pertinent Imaging: CT images personally reviewed and interpreted.  Stone density 1275 HU; SSD 8 cm  CT Renal Stone Study  Narrative CLINICAL DATA:  Flank pain, kidney stone suspected; right flank pain  EXAM: CT ABDOMEN AND PELVIS WITHOUT CONTRAST  TECHNIQUE: Multidetector CT imaging of the abdomen and pelvis was performed following the standard protocol without IV contrast.  RADIATION DOSE REDUCTION: This exam was performed according to the departmental dose-optimization program which includes  automated exposure control, adjustment of the mA and/or kV according to patient size and/or use of iterative reconstruction technique.  COMPARISON:  2018  FINDINGS: Lower chest: No acute abnormality.  Hepatobiliary: No focal liver abnormality is seen. No gallstones, gallbladder wall thickening, or biliary dilatation.  Pancreas: Stable appearance with atrophy.  Spleen: Unremarkable.  Adrenals/Urinary Tract: Adrenals are unremarkable. There is a 1.3 x 0.7 cm obstructing calculus within the right renal pelvis. Infiltration of parapelvic fat. Mild right hydronephrosis. Bladder is poorly distended.  Stomach/Bowel: Evidence of prior gastric bypass. Bowel is normal in caliber. There is stool throughout the colon.  Vascular/Lymphatic: Minor atherosclerosis.  No enlarged nodes.  Reproductive: Status post hysterectomy. No adnexal masses.  Other: No acute abnormality of the abdominal wall.  Musculoskeletal: Lower lumbar degenerative changes.  IMPRESSION: 1.3 cm obstructing calculus within the right renal pelvis with mild hydronephrosis.   Electronically Signed By: Guadlupe Spanish M.D. On: 10/11/2021 11:37  Assessment & Plan:   ° °1.  Right renal pelvic calculus °We discussed various treatment options for urolithiasis including observation with or without medical expulsive therapy, shockwave lithotripsy (SWL), ureteroscopy and laser lithotripsy with stent placement, and percutaneous nephrolithotomy. ° °We discussed that management is based on stone size, location, density, patient co-morbidities, and patient preference.  ° °Stones <5mm in size have a >80% spontaneous passage rate. Data surrounding the use of tamsulosin for medical expulsive therapy is controversial, but meta analyses suggests it is most efficacious for distal stones between 5-10mm in size. Possible side effects include dizziness/lightheadedness, and retrograde ejaculation. ° °SWL has a lower stone free rate in a single  procedure, but also a lower complication rate compared to ureteroscopy and avoids a stent and associated stent related symptoms. Possible complications include renal hematoma, steinstrasse, and need for additional treatment. ° °Ureteroscopy with laser lithotripsy and stent placement has a higher stone free rate than SWL in a single procedure, however increased complication rate including possible infection, ureteral injury, bleeding, and stent related morbidity. Common stent related symptoms include dysuria, urgency/frequency, and flank pain. ° °PCNL is the favored treatment for stones >2cm. It involves a small incision in the flank, with complete fragmentation of stones and removal. It has the highest stone free rate, but also the highest complication rate. Possible complications include bleeding, infection/sepsis, injury to surrounding organs including the pleura, and collecting system injury.  ° °After an extensive discussion of the risks and benefits of the above treatment options, the patient would like to proceed with SWL.  We did discuss this is a large, dense stone and the possibility of incomplete fragmentation with the need for repeat ESWL or alternative procedure.  We also discussed possibility of renal colic secondary to Steinstrasse which could potentially require ureteroscopy with stent placement.  All questions were answered and she desires to proceed. ° ° °Tynesia Harral C Adaya Garmany, MD ° °[facility] °[address], [address] °[place], [place] [address] °[phone] ° °

## 2021-10-13 ENCOUNTER — Encounter
Admission: RE | Disposition: A | Discharge status: Home / Self Care | Payer: Self-pay | Source: Home / Self Care | Attending: Urology

## 2021-10-13 ENCOUNTER — Ambulatory Visit
Admission: RE | Admit: 2021-10-13 | Discharge: 2021-10-13 | Disposition: A | Discharge status: Home / Self Care | Payer: BC Managed Care – PPO | Attending: Urology | Admitting: Urology

## 2021-10-13 ENCOUNTER — Encounter: Payer: Self-pay | Admitting: Urology

## 2021-10-13 ENCOUNTER — Ambulatory Visit: Payer: BC Managed Care – PPO

## 2021-10-13 ENCOUNTER — Other Ambulatory Visit: Payer: Self-pay

## 2021-10-13 DIAGNOSIS — N200 Calculus of kidney: Secondary | ICD-10-CM

## 2021-10-13 DIAGNOSIS — N132 Hydronephrosis with renal and ureteral calculous obstruction: Secondary | ICD-10-CM | POA: Insufficient documentation

## 2021-10-13 DIAGNOSIS — I10 Essential (primary) hypertension: Secondary | ICD-10-CM | POA: Diagnosis not present

## 2021-10-13 DIAGNOSIS — Z9884 Bariatric surgery status: Secondary | ICD-10-CM | POA: Diagnosis not present

## 2021-10-13 HISTORY — PX: EXTRACORPOREAL SHOCK WAVE LITHOTRIPSY: SHX1557

## 2021-10-13 LAB — MICROSCOPIC EXAMINATION
Bacteria, UA: NONE SEEN
RBC, Urine: >30 /hpf — AB (ref 0–2)

## 2021-10-13 LAB — URINALYSIS, COMPLETE
Bilirubin, UA: NEGATIVE
Nitrite, UA: NEGATIVE
Specific Gravity, UA: >1.03 — ABNORMAL HIGH (ref 1.005–1.030)
Urobilinogen, Ur: 0.2 mg/dL (ref 0.2–1.0)
pH, UA: 6 (ref 5.0–7.5)

## 2021-10-13 SURGERY — LITHOTRIPSY, ESWL
Anesthesia: Moderate Sedation | Laterality: Right

## 2021-10-13 MED ORDER — KETOROLAC TROMETHAMINE 30 MG/ML IJ SOLN
30.0000 mg | Freq: Once | INTRAMUSCULAR | Status: AC
  Administered 2021-10-13: 30 mg via INTRAVENOUS

## 2021-10-13 MED ORDER — HYDROCODONE-ACETAMINOPHEN 5-325 MG PO TABS
ORAL_TABLET | ORAL | Status: AC
  Administered 2021-10-13: 1
  Filled 2021-10-13: qty 1

## 2021-10-13 MED ORDER — HYDROCODONE-ACETAMINOPHEN 5-325 MG PO TABS: 1.0000 | ORAL_TABLET | Freq: Four times a day (QID) | ORAL | 0 refills | Status: DC | PRN

## 2021-10-13 MED ORDER — SODIUM CHLORIDE 0.9 % IV SOLN: INTRAVENOUS | Status: DC

## 2021-10-13 MED ORDER — DIAZEPAM 5 MG PO TABS: 10.0000 mg | ORAL_TABLET | ORAL | Status: AC

## 2021-10-13 MED ORDER — DIAZEPAM 5 MG PO TABS
ORAL_TABLET | ORAL | Status: AC
  Administered 2021-10-13: 10 mg via ORAL
  Filled 2021-10-13: qty 2

## 2021-10-13 MED ORDER — DIPHENHYDRAMINE HCL 25 MG PO CAPS: 25.0000 mg | ORAL_CAPSULE | ORAL | Status: AC

## 2021-10-13 MED ORDER — KETOROLAC TROMETHAMINE 30 MG/ML IJ SOLN
INTRAMUSCULAR | Status: AC
  Filled 2021-10-13: qty 1

## 2021-10-13 MED ORDER — CEPHALEXIN 500 MG PO CAPS
ORAL_CAPSULE | ORAL | Status: AC
  Administered 2021-10-13: 500 mg via ORAL
  Filled 2021-10-13: qty 1

## 2021-10-13 MED ORDER — ONDANSETRON HCL 4 MG/2ML IJ SOLN: 4.0000 mg | Freq: Once | INTRAMUSCULAR | Status: AC

## 2021-10-13 MED ORDER — DIPHENHYDRAMINE HCL 25 MG PO CAPS
ORAL_CAPSULE | ORAL | Status: AC
  Administered 2021-10-13: 25 mg via ORAL
  Filled 2021-10-13: qty 1

## 2021-10-13 MED ORDER — ONDANSETRON HCL 4 MG/2ML IJ SOLN
INTRAMUSCULAR | Status: AC
  Administered 2021-10-13: 4 mg via INTRAVENOUS
  Filled 2021-10-13: qty 2

## 2021-10-13 MED ORDER — CEPHALEXIN 500 MG PO CAPS: 500.0000 mg | ORAL_CAPSULE | Freq: Once | ORAL | Status: AC

## 2021-10-13 NOTE — Interval H&P Note (Signed)
History and Physical Interval Note:  10/13/2021 10:31 AM  [name]  has presented today for surgery, with the diagnosis of Right Nephrolithiasis.  The various methods of treatment have been discussed with the patient and family. After consideration of risks, benefits and other options for treatment, the patient has consented to  Procedure(s): EXTRACORPOREAL SHOCK WAVE LITHOTRIPSY (ESWL) (Right) as a surgical intervention.  The patient's history has been reviewed, patient examined, no change in status, stable for surgery.  I have reviewed the patient's chart and labs.  Questions were answered to the patient's satisfaction.    RRR CTAB   Kristin Copeland

## 2021-10-13 NOTE — Discharge Instructions (Addendum)
See [facility] discharge instructions in chart. AMBULATORY SURGERY  DISCHARGE INSTRUCTIONS   The drugs that you were given will stay in your system until tomorrow so for the next 24 hours you should not:  Drive an automobile Make any legal decisions Drink any alcoholic beverage   You may resume regular meals tomorrow.  Today it is better to start with liquids and gradually work up to solid foods.  You may eat anything you prefer, but it is better to start with liquids, then soup and crackers, and gradually work up to solid foods.   Please notify your doctor immediately if you have any unusual bleeding, trouble breathing, redness and pain at the surgery site, drainage, fever, or pain not relieved by medication.    Additional Instructions:        Please contact your physician with any problems or Same Day Surgery at [phone], Monday through Friday 6 am to 4 pm, or [place] [facility] [place] [facility] number at [phone].  

## 2021-10-13 NOTE — Progress Notes (Signed)
Received a message from discharging nurse.  Patient states she only has 6 tablets of Percocet left from a prescription 2 days ago at which time #15 were dispensed which is a 4-day supply.  We will plan to optimize her pain control prior to discharge today and if her pain remains severe tomorrow, she can call our office in the a.m. for refill.  It is unlikely that the pharmacist will refill the prescription today given that she was just dispensed 15 tablets 48 hours ago.  Vanna Scotland, MD

## 2021-10-14 ENCOUNTER — Encounter: Payer: Self-pay | Admitting: Urology

## 2021-11-01 NOTE — H&P (View-Only) (Signed)
11/02/2021 1:22 PM   Kristin Copeland 03-29-1966 [id number]  Referring provider: Juluis Pitch, MD [phone] [place],  [place] [address]  Chief Complaint  Patient presents with   Nephrolithiasis   Urological history: 1. Intraoperative ureteral injury -2000 with abdominal hysterectomy  HPI: Kristin Copeland is a 56 y.o. who is status post ESWL who presents today for follow up.  Underwent ESWL on 10/13/2021 for right 13 mm pelvic calculus with Dr. Erlene Quan.  Their postprocedural course was as expected and uneventful.   They have passed fragments. They bring in fragments for analysis.   KUB 13 mm right pelvic calculus has now been fragmented and pieces are located in the proximal ureter and lower pole of the right kidney.    UA 11-30 WBC's and many bacteria  She is still having episodes of right flank pain with nausea.  Patient denies any modifying or aggravating factors.  Patient denies any gross hematuria, dysuria or suprapubic/flank pain.  Patient denies any fevers, chills or vomiting.    PMH: Past Medical History:  Diagnosis Date   ADHD    Depression    GERD (gastroesophageal reflux disease)    improved with gastric bypass   Headache    migraines, improved with gastric bypass. now rare   Hypertension    improved with Gastric Bypass. hopes to DC meds soon   Kidney stones     Surgical History: Past Surgical History:  Procedure Laterality Date   ABDOMINAL HYSTERECTOMY  2000   EXTRACORPOREAL SHOCK WAVE LITHOTRIPSY Right 10/13/2021   Procedure: EXTRACORPOREAL SHOCK WAVE LITHOTRIPSY (ESWL);  Surgeon: Hollice Espy, MD;  Location: [facility];  Service: Urology;  Laterality: Right;   HALLUX VALGUS AUSTIN Right 04/04/2017   Procedure: [place] hallux valgus correction right foot;  Surgeon: Samara Deist, DPM;  Location: [place];  Service: Podiatry;  Laterality: Right;  Latex sensitivity   HAND SURGERY     LAPAROSCOPIC ROUX-EN-Y GASTRIC BYPASS WITH HIATAL HERNIA REPAIR N/A  08/08/2016   Procedure: LAPAROSCOPIC ROUX-EN-Y GASTRIC BYPASS WITH HIATAL HERNIA REPAIR, UPPER ENDO;  Surgeon: Greer Pickerel, MD;  Location: [facility];  Service: General;  Laterality: N/A;    Home Medications:  Current Outpatient Medications on File Prior to Visit  Medication Sig Dispense Refill   buPROPion (WELLBUTRIN) 75 MG tablet Take 2 tablets (150 mg total) by mouth 3 (three) times daily. (Patient taking differently: Take 100 mg by mouth 3 (three) times daily.) 180 tablet 0   Calcium 250 MG CAPS Take by mouth 2 (two) times daily.     linaclotide (LINZESS) 72 MCG capsule Take 72 mcg by mouth daily before breakfast.     meloxicam (MOBIC) 15 MG tablet Take 15 mg by mouth daily.     metFORMIN (GLUCOPHAGE) 500 MG tablet Take 500 mg by mouth 2 (two) times daily.     methylphenidate (RITALIN) 5 MG tablet Take 1 tablet (5 mg total) by mouth 2 (two) times daily. (Patient taking differently: Take 10 mg by mouth 2 (two) times daily.) 60 tablet 0   Multiple Vitamin (MULTI-VITAMINS) TABS Take 1 tablet by mouth daily.      pantoprazole (PROTONIX) 40 MG tablet Take 40 mg by mouth daily.     propranolol (INDERAL) 20 MG tablet Take 1.5 tablets (30 mg total) by mouth 2 (two) times daily. (Patient taking differently: Take 40 mg by mouth 2 (two) times daily.) 30 tablet 1   topiramate (TOPAMAX) 25 MG tablet TAKE 3 TABLETS (75 MG TOTAL) BY  MOUTH 2 (TWO) TIMES DAILY.     HYDROcodone-acetaminophen (NORCO) 5-325 MG tablet Take 1 tablet by mouth every 4 (four) hours as needed for moderate pain. (Patient not taking: Reported on 11/02/2021) 6 tablet 0   HYDROcodone-acetaminophen (NORCO) 5-325 MG tablet Take 1 tablet by mouth every 6 (six) hours as needed for moderate pain. (Patient not taking: Reported on 11/02/2021) 30 tablet 0   oxyCODONE-acetaminophen (PERCOCET) 5-325 MG tablet Take 1 tablet by mouth every 6 (six) hours as needed for severe pain. (Patient not taking: Reported on 11/02/2021) 15 tablet 0   tamsulosin (FLOMAX)  0.4 MG CAPS capsule Take 1 capsule (0.4 mg total) by mouth daily after supper. (Patient not taking: Reported on 11/02/2021) 10 capsule 0   No current facility-administered medications on file prior to visit.    Allergies:  Allergies  Allergen Reactions   Shellfish Allergy Shortness Of Breath and Rash   Nsaids     Avoids due to gastric bypass   Latex Rash    Only when wearing gloves for extended time.   Tolmetin Other (See Comments)    Avoids due to gastric bypass- Pt. doesn't remember reaction    Family History: Family History  Problem Relation Age of Onset   Diabetes Mother    Hypertension Mother    Cancer Mother    Cancer Sister    Cystic fibrosis Maternal Grandmother    Hypertension Maternal Grandmother    Kidney disease Maternal Grandmother     Social History:  reports that she has never smoked. She has never used smokeless tobacco. She reports current alcohol use. She reports that she does not use drugs.  ROS: Pertinent ROS in HPI  Physical Exam: BP (!) 124/93    Pulse 90    Ht 5' (1.524 m)    Wt 120 lb (54.4 kg)    BMI 23.44 kg/m   Constitutional:  Well nourished. Alert and oriented, No acute distress. HEENT: [place] AT, mask in place.   Trachea midline. Cardiovascular: No clubbing, cyanosis, or edema. Respiratory: Normal respiratory effort, no increased work of breathing. Neurologic: Grossly intact, no focal deficits, moving all 4 extremities. Psychiatric: Normal mood and affect.  Laboratory Data: Lab Results  Component Value Date   WBC 4.3 10/11/2021   HGB 12.6 10/11/2021   HCT 39.4 10/11/2021   MCV 94.9 10/11/2021   PLT 364 10/11/2021    Lab Results  Component Value Date   CREATININE 0.93 10/11/2021   Urinalysis Component     Latest Ref Rng & Units 11/02/2021  Specific Gravity, UA     1.005 - 1.030 >1.030 (H)  pH, UA     5.0 - 7.5 5.5  Color, UA     Yellow Yellow  Appearance Ur     Clear Cloudy (A)  Leukocytes,UA     Negative 1+ (A)   Protein,UA     Negative/Trace 1+ (A)  Glucose, UA     Negative Negative  Ketones, UA     Negative Negative  RBC, UA     Negative Trace (A)  Bilirubin, UA     Negative Negative  Urobilinogen, Ur     0.2 - 1.0 mg/dL 0.2  Nitrite, UA     Negative Negative  Microscopic Examination      See below:   Component     Latest Ref Rng & Units 11/02/2021  WBC, UA     0 - 5 /hpf 11-30 (A)  RBC     0 - 2 /  hpf 0-2  Epithelial Cells (non renal)     0 - 10 /hpf >10 (H)  Casts     None seen /lpf Present (A)  Cast Type     N/A Hyaline casts  Bacteria, UA     None seen/Few Many (A)  I have reviewed the labs.   Pertinent Imaging: CLINICAL DATA:  Post lithotripsy, right nephrolithiasis.   EXAM: ABDOMEN - 1 VIEW   COMPARISON:  Radiograph 10/13/2021   FINDINGS: Previous 14 mm stone appears fragmented with amorphous stone fragments projecting over the lower right renal bed. There is an elongated stone fragment measuring 16 mm at the right of L3-L4 in the region of the proximal ureter. Pelvic calcifications consistent with phleboliths are unchanged. Normal bowel gas pattern with moderate colonic stool burden.   IMPRESSION: The previous 14 mm right renal calculus appears fragmented with amorphous calcifications projecting over the lower right renal bed. There is also a 16 mm elongated calcification to the right of L3-L4 may be within the ureter.     Electronically Signed   By: Keith Rake M.D.   On: 11/03/2021 13:33 I have independently reviewed the films.    Assessment & Plan:    1. Right renal stone  -s/p right ESWL -large fragments remaining -UA with pyuria and bacteruria -urine sent for culture -Septra DS, BID x 7 days  -She would like to proceed with ureteroscopy at this time - schedule right ureteroscopy with laser lithotripsy and ureteral stent placement - explained to the patient how the procedure is performed and the risks involved - informed patient that  they will have a stent placed during the procedure and will remain in place after the procedure for a short time.  - stent may be removed in the office with a cystoscope or patient may be instructed to remove the stent themselves by the string - described "stent pain" as feelings of needing to urinate/overactive bladder and a warm, tingling sensation to intense pain in the affected flank - residual stones within the kidney or ureter may be present after the procedure and may need to have these addressed at a different encounter - injury to the ureter is the most common intra-operative Copeland, it may result in an open procedure to correct the defect - infection and bleeding are also risks - explained the risks of general anesthesia, such as: MI, CVA, paralysis, coma and/or death. - advised to contact our office or seek treatment in the ED if becomes febrile or pain/ vomiting are difficult control in order to arrange for emergent/urgent intervention  2. Renal colic -refilled tamsulosin 0.4 mg   3. Pre-op testing  -urine sent for culture -started on Septra DS    Return for Right URS/LL/ureteral stent placement.  These notes generated with voice recognition software. I apologize for typographical errors.  Zara Council, PA-C  [facility] [address]  [place], [place] [address] [phone]

## 2021-11-01 NOTE — Progress Notes (Signed)
11/02/2021 1:22 PM   Roselind Messier Aug 29, 1966 [id number]  Referring provider: Juluis Pitch, MD [phone] [place],  [place] [address]  Chief Complaint  Patient presents with   Nephrolithiasis   Urological history: 1. Intraoperative ureteral injury -2000 with abdominal hysterectomy  HPI: Kristin Copeland is a 56 y.o. who is status post ESWL who presents today for follow up.  Underwent ESWL on 10/13/2021 for right 13 mm pelvic calculus with Dr. Erlene Quan.  Their postprocedural course was as expected and uneventful.   They have passed fragments. They bring in fragments for analysis.   KUB 13 mm right pelvic calculus has now been fragmented and pieces are located in the proximal ureter and lower pole of the right kidney.    UA 11-30 WBC's and many bacteria  She is still having episodes of right flank pain with nausea.  Patient denies any modifying or aggravating factors.  Patient denies any gross hematuria, dysuria or suprapubic/flank pain.  Patient denies any fevers, chills or vomiting.    PMH: Past Medical History:  Diagnosis Date   ADHD    Depression    GERD (gastroesophageal reflux disease)    improved with gastric bypass   Headache    migraines, improved with gastric bypass. now rare   Hypertension    improved with Gastric Bypass. hopes to DC meds soon   Kidney stones     Surgical History: Past Surgical History:  Procedure Laterality Date   ABDOMINAL HYSTERECTOMY  2000   EXTRACORPOREAL SHOCK WAVE LITHOTRIPSY Right 10/13/2021   Procedure: EXTRACORPOREAL SHOCK WAVE LITHOTRIPSY (ESWL);  Surgeon: Hollice Espy, MD;  Location: [facility];  Service: Urology;  Laterality: Right;   HALLUX VALGUS AUSTIN Right 04/04/2017   Procedure: [place] hallux valgus correction right foot;  Surgeon: Samara Deist, DPM;  Location: [facility];  Service: Podiatry;  Laterality: Right;  Latex sensitivity   HAND SURGERY     LAPAROSCOPIC ROUX-EN-Y GASTRIC BYPASS WITH HIATAL HERNIA REPAIR N/A  08/08/2016   Procedure: LAPAROSCOPIC ROUX-EN-Y GASTRIC BYPASS WITH HIATAL HERNIA REPAIR, UPPER ENDO;  Surgeon: Greer Pickerel, MD;  Location: [facility];  Service: General;  Laterality: N/A;    Home Medications:  Current Outpatient Medications on File Prior to Visit  Medication Sig Dispense Refill   buPROPion (WELLBUTRIN) 75 MG tablet Take 2 tablets (150 mg total) by mouth 3 (three) times daily. (Patient taking differently: Take 100 mg by mouth 3 (three) times daily.) 180 tablet 0   Calcium 250 MG CAPS Take by mouth 2 (two) times daily.     linaclotide (LINZESS) 72 MCG capsule Take 72 mcg by mouth daily before breakfast.     meloxicam (MOBIC) 15 MG tablet Take 15 mg by mouth daily.     metFORMIN (GLUCOPHAGE) 500 MG tablet Take 500 mg by mouth 2 (two) times daily.     methylphenidate (RITALIN) 5 MG tablet Take 1 tablet (5 mg total) by mouth 2 (two) times daily. (Patient taking differently: Take 10 mg by mouth 2 (two) times daily.) 60 tablet 0   Multiple Vitamin (MULTI-VITAMINS) TABS Take 1 tablet by mouth daily.      pantoprazole (PROTONIX) 40 MG tablet Take 40 mg by mouth daily.     propranolol (INDERAL) 20 MG tablet Take 1.5 tablets (30 mg total) by mouth 2 (two) times daily. (Patient taking differently: Take 40 mg by mouth 2 (two) times daily.) 30 tablet 1   topiramate (TOPAMAX) 25 MG tablet TAKE 3 TABLETS (75 MG TOTAL) BY  MOUTH 2 (TWO) TIMES DAILY.     HYDROcodone-acetaminophen (NORCO) 5-325 MG tablet Take 1 tablet by mouth every 4 (four) hours as needed for moderate pain. (Patient not taking: Reported on 11/02/2021) 6 tablet 0   HYDROcodone-acetaminophen (NORCO) 5-325 MG tablet Take 1 tablet by mouth every 6 (six) hours as needed for moderate pain. (Patient not taking: Reported on 11/02/2021) 30 tablet 0   oxyCODONE-acetaminophen (PERCOCET) 5-325 MG tablet Take 1 tablet by mouth every 6 (six) hours as needed for severe pain. (Patient not taking: Reported on 11/02/2021) 15 tablet 0   tamsulosin (FLOMAX)  0.4 MG CAPS capsule Take 1 capsule (0.4 mg total) by mouth daily after supper. (Patient not taking: Reported on 11/02/2021) 10 capsule 0   No current facility-administered medications on file prior to visit.    Allergies:  Allergies  Allergen Reactions   Shellfish Allergy Shortness Of Breath and Rash   Nsaids     Avoids due to gastric bypass   Latex Rash    Only when wearing gloves for extended time.   Tolmetin Other (See Comments)    Avoids due to gastric bypass- Pt. doesn't remember reaction    Family History: Family History  Problem Relation Age of Onset   Diabetes Mother    Hypertension Mother    Cancer Mother    Cancer Sister    Cystic fibrosis Maternal Grandmother    Hypertension Maternal Grandmother    Kidney disease Maternal Grandmother     Social History:  reports that she has never smoked. She has never used smokeless tobacco. She reports current alcohol use. She reports that she does not use drugs.  ROS: Pertinent ROS in HPI  Physical Exam: BP (!) 124/93    Pulse 90    Ht 5' (1.524 m)    Wt 120 lb (54.4 kg)    BMI 23.44 kg/m   Constitutional:  Well nourished. Alert and oriented, No acute distress. HEENT: [place] AT, mask in place.   Trachea midline. Cardiovascular: No clubbing, cyanosis, or edema. Respiratory: Normal respiratory effort, no increased work of breathing. Neurologic: Grossly intact, no focal deficits, moving all 4 extremities. Psychiatric: Normal mood and affect.  Laboratory Data: Lab Results  Component Value Date   WBC 4.3 10/11/2021   HGB 12.6 10/11/2021   HCT 39.4 10/11/2021   MCV 94.9 10/11/2021   PLT 364 10/11/2021    Lab Results  Component Value Date   CREATININE 0.93 10/11/2021   Urinalysis Component     Latest Ref Rng & Units 11/02/2021  Specific Gravity, UA     1.005 - 1.030 >1.030 (H)  pH, UA     5.0 - 7.5 5.5  Color, UA     Yellow Yellow  Appearance Ur     Clear Cloudy (A)  Leukocytes,UA     Negative 1+ (A)   Protein,UA     Negative/Trace 1+ (A)  Glucose, UA     Negative Negative  Ketones, UA     Negative Negative  RBC, UA     Negative Trace (A)  Bilirubin, UA     Negative Negative  Urobilinogen, Ur     0.2 - 1.0 mg/dL 0.2  Nitrite, UA     Negative Negative  Microscopic Examination      See below:   Component     Latest Ref Rng & Units 11/02/2021  WBC, UA     0 - 5 /hpf 11-30 (A)  RBC     0 - 2 /  hpf 0-2  Epithelial Cells (non renal)     0 - 10 /hpf >10 (H)  Casts     None seen /lpf Present (A)  Cast Type     N/A Hyaline casts  Bacteria, UA     None seen/Few Many (A)  I have reviewed the labs.   Pertinent Imaging: CLINICAL DATA:  Post lithotripsy, right nephrolithiasis.   EXAM: ABDOMEN - 1 VIEW   COMPARISON:  Radiograph 10/13/2021   FINDINGS: Previous 14 mm stone appears fragmented with amorphous stone fragments projecting over the lower right renal bed. There is an elongated stone fragment measuring 16 mm at the right of L3-L4 in the region of the proximal ureter. Pelvic calcifications consistent with phleboliths are unchanged. Normal bowel gas pattern with moderate colonic stool burden.   IMPRESSION: The previous 14 mm right renal calculus appears fragmented with amorphous calcifications projecting over the lower right renal bed. There is also a 16 mm elongated calcification to the right of L3-L4 may be within the ureter.     Electronically Signed   By: Keith Rake M.D.   On: 11/03/2021 13:33 I have independently reviewed the films.    Assessment & Plan:    1. Right renal stone  -s/p right ESWL -large fragments remaining -UA with pyuria and bacteruria -urine sent for culture -Septra DS, BID x 7 days  -She would like to proceed with ureteroscopy at this time - schedule right ureteroscopy with laser lithotripsy and ureteral stent placement - explained to the patient how the procedure is performed and the risks involved - informed patient that  they will have a stent placed during the procedure and will remain in place after the procedure for a short time.  - stent may be removed in the office with a cystoscope or patient may be instructed to remove the stent themselves by the string - described "stent pain" as feelings of needing to urinate/overactive bladder and a warm, tingling sensation to intense pain in the affected flank - residual stones within the kidney or ureter may be present after the procedure and may need to have these addressed at a different encounter - injury to the ureter is the most common intra-operative risk, it may result in an open procedure to correct the defect - infection and bleeding are also risks - explained the risks of general anesthesia, such as: MI, CVA, paralysis, coma and/or death. - advised to contact our office or seek treatment in the ED if becomes febrile or pain/ vomiting are difficult control in order to arrange for emergent/urgent intervention  2. Renal colic -refilled tamsulosin 0.4 mg   3. Pre-op testing  -urine sent for culture -started on Septra DS    Return for Right URS/LL/ureteral stent placement.  These notes generated with voice recognition software. I apologize for typographical errors.  Zara Council, PA-C  [facility] [address]  [place], [place] [address] [phone]

## 2021-11-02 ENCOUNTER — Ambulatory Visit
Admission: RE | Admit: 2021-11-02 | Discharge: 2021-11-02 | Disposition: A | Discharge status: Home / Self Care | Payer: BC Managed Care – PPO | Attending: Urology | Admitting: Urology

## 2021-11-02 ENCOUNTER — Other Ambulatory Visit: Payer: Self-pay

## 2021-11-02 ENCOUNTER — Other Ambulatory Visit: Payer: Self-pay | Admitting: Urology

## 2021-11-02 ENCOUNTER — Ambulatory Visit: Payer: BC Managed Care – PPO | Admitting: Urology

## 2021-11-02 ENCOUNTER — Ambulatory Visit
Admission: RE | Admit: 2021-11-02 | Discharge: 2021-11-02 | Disposition: A | Discharge status: Home / Self Care | Payer: BC Managed Care – PPO | Source: Ambulatory Visit | Attending: Urology | Admitting: Urology

## 2021-11-02 ENCOUNTER — Encounter: Payer: Self-pay | Admitting: Urology

## 2021-11-02 VITALS — BP 124/93 | HR 90 | Ht 60.0 in | Wt 120.0 lb

## 2021-11-02 DIAGNOSIS — N201 Calculus of ureter: Secondary | ICD-10-CM

## 2021-11-02 DIAGNOSIS — N200 Calculus of kidney: Secondary | ICD-10-CM

## 2021-11-02 DIAGNOSIS — N23 Unspecified renal colic: Secondary | ICD-10-CM | POA: Diagnosis not present

## 2021-11-02 DIAGNOSIS — Z01818 Encounter for other preprocedural examination: Secondary | ICD-10-CM | POA: Diagnosis not present

## 2021-11-02 LAB — MICROSCOPIC EXAMINATION: Epithelial Cells (non renal): >10 /hpf — ABNORMAL HIGH (ref 0–10)

## 2021-11-02 LAB — URINALYSIS, COMPLETE
Bilirubin, UA: NEGATIVE
Glucose, UA: NEGATIVE
Ketones, UA: NEGATIVE
Nitrite, UA: NEGATIVE
Specific Gravity, UA: >1.03 — ABNORMAL HIGH (ref 1.005–1.030)
Urobilinogen, Ur: 0.2 mg/dL (ref 0.2–1.0)
pH, UA: 5.5 (ref 5.0–7.5)

## 2021-11-02 MED ORDER — SULFAMETHOXAZOLE-TRIMETHOPRIM 800-160 MG PO TABS: 1.0000 | ORAL_TABLET | Freq: Two times a day (BID) | ORAL | 0 refills | Status: DC

## 2021-11-02 MED ORDER — TAMSULOSIN HCL 0.4 MG PO CAPS: 0.4000 mg | ORAL_CAPSULE | Freq: Every day | ORAL | 3 refills | Status: DC

## 2021-11-02 NOTE — Progress Notes (Signed)
Surgical Physician Order Form [facility] [place]  * Scheduling expectation : ASAP  *Length of Case:   *Clearance needed: no  *Anticoagulation Instructions: Hold all anticoagulants  *Aspirin Instructions: Hold Aspirin  *Post-op visit Date/Instructions:  1 month with RUS prior  *Diagnosis: Right Ureteral Stone  *Procedure: right Ureteroscopy w/laser lithotripsy & stent placement ([id number])   Additional orders: N/A  -Admit type: OUTpatient  -Anesthesia: General  -VTE Prophylaxis Standing Order SCDs       Other:   -Standing Lab Orders Per Anesthesia    Lab other: None  -Standing Test orders EKG/Chest x-ray per Anesthesia       Test other:   - Medications:  Ancef 2gm IV  -Other orders:  N/A

## 2021-11-03 NOTE — Progress Notes (Signed)
[facility] Urological [facility] Posting Form   Surgery Date/Time: Date: 11/08/2021  Surgeon: Dr. Irineo Axon, MD  Surgery Location: Day Surgery  Inpt ( No  )   Outpt (Yes)   Obs ( No  )   Diagnosis: N20.1 Right Ureteral Stone  -CPT: [phone]  Surgery: Right Ureteroscopy with laser lithotripsy and stent placement  Stop Anticoagulations: Yes and also hold ASA  Cardiac/Medical/Pulmonary Clearance needed: no  *Orders entered into EPIC  Date: 11/03/21   *Case booked in [place]  Date: 11/02/2021  *Notified pt of Surgery: Date: 11/03/2021  PRE-OP UA & CX: Yes, Obtained in clinic 11/02/2021  *Placed into Prior Authorization Work Angela Nevin Date: 11/03/21   Assistant/laser/rep:No

## 2021-11-04 ENCOUNTER — Other Ambulatory Visit: Payer: Self-pay

## 2021-11-04 ENCOUNTER — Other Ambulatory Visit
Admission: RE | Admit: 2021-11-04 | Discharge: 2021-11-04 | Disposition: A | Discharge status: Home / Self Care | Payer: BC Managed Care – PPO | Source: Ambulatory Visit | Attending: Urology | Admitting: Urology

## 2021-11-04 HISTORY — DX: Personal history of other diseases of the digestive system: Z87.19

## 2021-11-04 HISTORY — DX: Sleep apnea, unspecified: G47.30

## 2021-11-04 HISTORY — DX: Anemia, unspecified: D64.9

## 2021-11-04 HISTORY — DX: Personal history of urinary calculi: Z87.442

## 2021-11-04 NOTE — Patient Instructions (Addendum)
Your procedure is scheduled on: 11/08/21 - Tuesday Report to the Registration Desk on the 1st floor of the [facility]. To find out your arrival time, please call [phone] between 1PM - 3PM on: 11/07/21 - Monday  REMEMBER: Instructions that are not followed completely may result in serious medical risk, up to and including death; or upon the discretion of your surgeon and anesthesiologist your surgery may need to be rescheduled.  TAKE THESE MEDICATIONS THE MORNING OF SURGERY WITH A SIP OF WATER:  - buPROPion (WELLBUTRIN) 100 MG tablet - methylphenidate (RITALIN) 5 MG tablet - pantoprazole (PROTONIX) 40 MG tablet,(take one the night before and one on the morning of surgery - helps to prevent nausea after surgery.) - propranolol (INDERAL) 20 MG tablet - topiramate (TOPAMAX) 25 MG tablet - tamsulosin (FLOMAX) 0.4 MG CAPS capsule   -Do Not Take metFORMIN (GLUCOPHAGE) 500 MG tablet ON 02/26, 02/27 and do not take the day of surgery.   One week prior to surgery: meloxicam (MOBIC) 15 MG tablet Stop Anti-inflammatories (NSAIDS) such as Advil, Aleve, Ibuprofen, Motrin, Naproxen, Naprosyn and Aspirin based products such as Excedrin, Goodys Powder, BC Powder.  Stop ANY OVER THE COUNTER supplements until after surgery.  You may take Tylenol if needed for pain up until the day of surgery.  No Alcohol for 24 hours before or after surgery.  No Smoking including e-cigarettes for 24 hours prior to surgery.  No chewable tobacco products for at least 6 hours prior to surgery.  No nicotine patches on the day of surgery.  Do not use any "recreational" drugs for at least a week prior to your surgery.  Please be advised that the combination of cocaine and anesthesia may have negative outcomes, up to and including death. If you test positive for cocaine, your surgery will be cancelled.  On the morning of surgery brush your teeth with toothpaste and water, you may rinse your mouth with mouthwash if  you wish. Do not swallow any toothpaste or mouthwash.  Do not wear jewelry, make-up, hairpins, clips or nail polish.  Do not wear lotions, powders, or perfumes.   Do not shave body from the neck down 48 hours prior to surgery just in case you cut yourself which could leave a site for infection.  Also, freshly shaved skin may become irritated if using the CHG soap.  Contact lenses, hearing aids and dentures may not be worn into surgery.  Do not bring valuables to the hospital. [facility] is not responsible for any missing/lost belongings or valuables.   Notify your doctor if there is any change in your medical condition (cold, fever, infection).  Wear comfortable clothing (specific to your surgery type) to the hospital.  After surgery, you can help prevent lung complications by doing breathing exercises.  Take deep breaths and cough every 1-2 hours. Your doctor may order a device called an Incentive Spirometer to help you take deep breaths. When coughing or sneezing, hold a pillow firmly against your incision with both hands. This is called splinting. Doing this helps protect your incision. It also decreases belly discomfort.  If you are being admitted to the hospital overnight, leave your suitcase in the car. After surgery it may be brought to your room.  If you are being discharged the day of surgery, you will not be allowed to drive home. You will need a responsible adult (18 years or older) to drive you home and stay with you that night.   If you are  taking public transportation, you will need to have a responsible adult (18 years or older) with you. Please confirm with your physician that it is acceptable to use public transportation.   Please call the Pre-admissions Testing Dept. at [phone] if you have any questions about these instructions.  Surgery Visitation Policy:  Patients undergoing a surgery or procedure may have one family member or support person with them  as long as that person is not COVID-19 positive or experiencing its symptoms.  That person may remain in the waiting area during the procedure and may rotate out with other people.  Inpatient Visitation:    Visiting hours are 7 a.m. to 8 p.m. Up to two visitors ages 16+ are allowed at one time in a patient room. The visitors may rotate out with other people during the day. Visitors must check out when they leave, or other visitors will not be allowed. One designated support person may remain overnight. The visitor must pass COVID-19 screenings, use hand sanitizer when entering and exiting the patients room and wear a mask at all times, including in the patients room. Patients must also wear a mask when staff or their visitor are in the room. Masking is required regardless of vaccination status.

## 2021-11-05 LAB — CULTURE, URINE COMPREHENSIVE

## 2021-11-07 LAB — CALCULI, WITH PHOTOGRAPH (CLINICAL LAB)
Calcium Oxalate Monohydrate: 100 %
Weight Calculi: 37 mg

## 2021-11-07 MED ORDER — CEFAZOLIN SODIUM-DEXTROSE 2-4 GM/100ML-% IV SOLN
2.0000 g | INTRAVENOUS | Status: AC
  Administered 2021-11-08: 2 g via INTRAVENOUS

## 2021-11-07 MED ORDER — LACTATED RINGERS IV SOLN: INTRAVENOUS | Status: DC

## 2021-11-07 MED ORDER — CHLORHEXIDINE GLUCONATE 0.12 % MT SOLN: 15.0000 mL | Freq: Once | OROMUCOSAL | Status: AC

## 2021-11-07 MED ORDER — ORAL CARE MOUTH RINSE: 15.0000 mL | Freq: Once | OROMUCOSAL | Status: AC

## 2021-11-08 ENCOUNTER — Ambulatory Visit
Admission: RE | Admit: 2021-11-08 | Discharge: 2021-11-08 | Disposition: A | Discharge status: Home / Self Care | Payer: BC Managed Care – PPO | Source: Ambulatory Visit | Attending: Urology | Admitting: Urology

## 2021-11-08 ENCOUNTER — Ambulatory Visit: Payer: BC Managed Care – PPO | Admitting: Anesthesiology

## 2021-11-08 ENCOUNTER — Other Ambulatory Visit: Payer: Self-pay

## 2021-11-08 ENCOUNTER — Encounter
Admission: RE | Disposition: A | Discharge status: Home / Self Care | Payer: Self-pay | Source: Ambulatory Visit | Attending: Urology

## 2021-11-08 ENCOUNTER — Encounter: Payer: Self-pay | Admitting: Urology

## 2021-11-08 ENCOUNTER — Ambulatory Visit: Payer: BC Managed Care – PPO

## 2021-11-08 DIAGNOSIS — N200 Calculus of kidney: Secondary | ICD-10-CM

## 2021-11-08 DIAGNOSIS — F909 Attention-deficit hyperactivity disorder, unspecified type: Secondary | ICD-10-CM | POA: Diagnosis not present

## 2021-11-08 DIAGNOSIS — I10 Essential (primary) hypertension: Secondary | ICD-10-CM | POA: Diagnosis not present

## 2021-11-08 DIAGNOSIS — N201 Calculus of ureter: Secondary | ICD-10-CM

## 2021-11-08 DIAGNOSIS — K219 Gastro-esophageal reflux disease without esophagitis: Secondary | ICD-10-CM | POA: Insufficient documentation

## 2021-11-08 DIAGNOSIS — F32A Depression, unspecified: Secondary | ICD-10-CM | POA: Insufficient documentation

## 2021-11-08 DIAGNOSIS — N202 Calculus of kidney with calculus of ureter: Secondary | ICD-10-CM | POA: Diagnosis present

## 2021-11-08 HISTORY — PX: CYSTOSCOPY/URETEROSCOPY/HOLMIUM LASER/STENT PLACEMENT: SHX6546

## 2021-11-08 SURGERY — CYSTOSCOPY/URETEROSCOPY/HOLMIUM LASER/STENT PLACEMENT
Anesthesia: General | Laterality: Right

## 2021-11-08 MED ORDER — OXYCODONE-ACETAMINOPHEN 5-325 MG PO TABS: 1.0000 | ORAL_TABLET | Freq: Four times a day (QID) | ORAL | 0 refills | Status: DC | PRN

## 2021-11-08 MED ORDER — APREPITANT 40 MG PO CAPS
ORAL_CAPSULE | ORAL | Status: AC
  Filled 2021-11-08: qty 1

## 2021-11-08 MED ORDER — OXYBUTYNIN CHLORIDE 5 MG PO TABS: ORAL_TABLET | ORAL | 0 refills | Status: DC

## 2021-11-08 MED ORDER — MIDAZOLAM HCL 2 MG/2ML IJ SOLN
INTRAMUSCULAR | Status: AC
  Filled 2021-11-08: qty 2

## 2021-11-08 MED ORDER — FENTANYL CITRATE (PF) 100 MCG/2ML IJ SOLN
INTRAMUSCULAR | Status: AC
  Filled 2021-11-08: qty 2

## 2021-11-08 MED ORDER — APREPITANT 40 MG PO CAPS
40.0000 mg | ORAL_CAPSULE | Freq: Once | ORAL | Status: AC
  Administered 2021-11-08: 40 mg via ORAL

## 2021-11-08 MED ORDER — CHLORHEXIDINE GLUCONATE 0.12 % MT SOLN
OROMUCOSAL | Status: AC
  Administered 2021-11-08: 15 mL via OROMUCOSAL
  Filled 2021-11-08: qty 15

## 2021-11-08 MED ORDER — MIDAZOLAM HCL 2 MG/2ML IJ SOLN
INTRAMUSCULAR | Status: DC | PRN
  Administered 2021-11-08: 2 mg via INTRAVENOUS

## 2021-11-08 MED ORDER — SUCCINYLCHOLINE CHLORIDE 200 MG/10ML IV SOSY: PREFILLED_SYRINGE | INTRAVENOUS | Status: DC | PRN

## 2021-11-08 MED ORDER — CEFAZOLIN SODIUM-DEXTROSE 2-4 GM/100ML-% IV SOLN
INTRAVENOUS | Status: AC
  Filled 2021-11-08: qty 100

## 2021-11-08 MED ORDER — FENTANYL CITRATE (PF) 100 MCG/2ML IJ SOLN
INTRAMUSCULAR | Status: DC | PRN
  Administered 2021-11-08: 50 ug via INTRAVENOUS

## 2021-11-08 MED ORDER — SUGAMMADEX SODIUM 200 MG/2ML IV SOLN
INTRAVENOUS | Status: DC | PRN
Start: 2021-11-08 — End: 2021-11-08
  Administered 2021-11-08: 150 mg via INTRAVENOUS

## 2021-11-08 MED ORDER — ROCURONIUM BROMIDE 100 MG/10ML IV SOLN
INTRAVENOUS | Status: DC | PRN
Start: 2021-11-08 — End: 2021-11-08
  Administered 2021-11-08: 30 mg via INTRAVENOUS

## 2021-11-08 MED ORDER — FENTANYL CITRATE (PF) 100 MCG/2ML IJ SOLN: 25.0000 ug | INTRAMUSCULAR | Status: DC | PRN

## 2021-11-08 MED ORDER — PROPOFOL 10 MG/ML IV BOLUS
INTRAVENOUS | Status: DC | PRN
  Administered 2021-11-08: 150 mg via INTRAVENOUS

## 2021-11-08 MED ORDER — ONDANSETRON HCL 4 MG/2ML IJ SOLN
INTRAMUSCULAR | Status: DC | PRN
Start: 2021-11-08 — End: 2021-11-08
  Administered 2021-11-08: 4 mg via INTRAVENOUS

## 2021-11-08 MED ORDER — DEXAMETHASONE SODIUM PHOSPHATE 10 MG/ML IJ SOLN
INTRAMUSCULAR | Status: DC | PRN
  Administered 2021-11-08: 10 mg via INTRAVENOUS

## 2021-11-08 MED ORDER — IOHEXOL 180 MG/ML  SOLN
INTRAMUSCULAR | Status: DC | PRN
  Administered 2021-11-08: 20 mL

## 2021-11-08 MED ORDER — ONDANSETRON HCL 4 MG/2ML IJ SOLN: 4.0000 mg | Freq: Once | INTRAMUSCULAR | Status: DC | PRN

## 2021-11-08 MED ORDER — LIDOCAINE HCL (CARDIAC) PF 100 MG/5ML IV SOSY
PREFILLED_SYRINGE | INTRAVENOUS | Status: DC | PRN
Start: 2021-11-08 — End: 2021-11-08
  Administered 2021-11-08: 100 mg via INTRAVENOUS

## 2021-11-08 MED ORDER — LACTATED RINGERS IV SOLN: INTRAVENOUS | Status: DC | PRN

## 2021-11-08 SURGICAL SUPPLY — 31 items
BAG DRAIN CYSTO-URO LG1000N (MISCELLANEOUS) ×2 IMPLANT
BASKET ZERO TIP 1.9FR (BASKET) IMPLANT
BRUSH SCRUB EZ 1% IODOPHOR (MISCELLANEOUS) ×2 IMPLANT
BSKT STON RTRVL ZERO TP 1.9FR (BASKET)
CATH URET FLEX-TIP 2 LUMEN 10F (CATHETERS) IMPLANT
CATH URETL OPEN END 6X70 (CATHETERS) IMPLANT
CNTNR SPEC 2.5X3XGRAD LEK (MISCELLANEOUS)
CONT SPEC 4OZ STER OR WHT (MISCELLANEOUS)
CONT SPEC 4OZ STRL OR WHT (MISCELLANEOUS)
CONTAINER SPEC 2.5X3XGRAD LEK (MISCELLANEOUS) IMPLANT
DRAPE UTILITY 15X26 TOWEL STRL (DRAPES) ×2 IMPLANT
GAUZE 4X4 16PLY ~~LOC~~+RFID DBL (SPONGE) ×4 IMPLANT
GLOVE SURG UNDER POLY LF SZ7.5 (GLOVE) ×2 IMPLANT
GOWN STRL REUS W/ TWL LRG LVL3 (GOWN DISPOSABLE) ×1 IMPLANT
GOWN STRL REUS W/ TWL XL LVL3 (GOWN DISPOSABLE) ×1 IMPLANT
GOWN STRL REUS W/TWL LRG LVL3 (GOWN DISPOSABLE) ×2
GOWN STRL REUS W/TWL XL LVL3 (GOWN DISPOSABLE) ×2
GUIDEWIRE STR DUAL SENSOR (WIRE) ×2 IMPLANT
INFUSOR MANOMETER BAG 3000ML (MISCELLANEOUS) ×2 IMPLANT
IV NS IRRIG 3000ML ARTHROMATIC (IV SOLUTION) ×2 IMPLANT
KIT TURNOVER CYSTO (KITS) ×2 IMPLANT
PACK CYSTO AR (MISCELLANEOUS) ×2 IMPLANT
SET CYSTO W/LG BORE CLAMP LF (SET/KITS/TRAYS/PACK) ×2 IMPLANT
SHEATH URETERAL 12FRX35CM (MISCELLANEOUS) IMPLANT
STENT URET 6FRX22 CONTOUR (STENTS) ×1 IMPLANT
STENT URET 6FRX24 CONTOUR (STENTS) IMPLANT
STENT URET 6FRX26 CONTOUR (STENTS) IMPLANT
SURGILUBE 2OZ TUBE FLIPTOP (MISCELLANEOUS) ×2 IMPLANT
TRACTIP FLEXIVA PULSE ID 200 (Laser) ×2 IMPLANT
VALVE UROSEAL ADJ ENDO (VALVE) IMPLANT
WATER STERILE IRR 500ML POUR (IV SOLUTION) ×2 IMPLANT

## 2021-11-08 NOTE — Transfer of Care (Signed)
Immediate Anesthesia Transfer of Care Note  Patient: Kristin Copeland  Procedure(s) Performed: CYSTOSCOPY/URETEROSCOPY STENT PLACEMENT (Right)  Patient Location: PACU  Anesthesia Type:General  Level of Consciousness: drowsy  Airway & Oxygen Therapy: Patient Spontanous Breathing and Patient connected to face mask oxygen  Post-op Assessment: Report given to RN and Post -op Vital signs reviewed and stable  Post vital signs: Reviewed and stable  Last Vitals:  Vitals Value Taken Time  BP 139/105 11/08/21 1103  Temp    Pulse 62 11/08/21 1105  Resp 0 11/08/21 1105  SpO2 100 % 11/08/21 1105  Vitals shown include unvalidated device data.  Last Pain:  Vitals:   11/08/21 0910  TempSrc: Temporal  PainSc: 0-No pain         Complications: No notable events documented.

## 2021-11-08 NOTE — Anesthesia Procedure Notes (Signed)
Procedure Name: Intubation Date/Time: 11/08/2021 10:19 AM Performed by: Beverely Low, CRNA Pre-anesthesia Checklist: Patient identified, Patient being monitored, Timeout performed, Emergency Drugs available and Suction available Patient Re-evaluated:Patient Re-evaluated prior to induction Oxygen Delivery Method: Circle system utilized Preoxygenation: Pre-oxygenation with 100% oxygen Induction Type: IV induction Ventilation: Mask ventilation without difficulty Laryngoscope Size: McGraph and 4 Grade View: Grade II Tube type: Oral Tube size: 7.5 mm Number of attempts: 1 Airway Equipment and Method: Stylet Placement Confirmation: ETT inserted through vocal cords under direct vision, positive ETCO2 and breath sounds checked- equal and bilateral Secured at: 21 cm Tube secured with: Tape Dental Injury: Teeth and Oropharynx as per pre-operative assessment

## 2021-11-08 NOTE — Interval H&P Note (Signed)
History and Physical Interval Note:  SWL of a 13 mm right renal pelvic calculus 10/13/2021.  On postop follow-up significant residual fragments proximal ureter.  Excellent fragmentation however there is a 4 mm leading fragment.  Multiple small fragments lower pole.  Scheduled for ureteroscopy with stone removal.  We discussed there is a low chance that ureteroscopy of the upper tract would not be successful secondary to anatomy and if this were to occur a stent would be placed and she would need follow-up treatment.  All questions were answered and she desires to proceed.  CV:RRR Lungs:clear  11/08/2021 9:56 AM  Kristin Copeland  has presented today for surgery, with the diagnosis of Right Ureteral Stone.  The various methods of treatment have been discussed with the patient and family. After consideration of risks, benefits and other options for treatment, the patient has consented to  Procedure(s): CYSTOSCOPY/URETEROSCOPY/HOLMIUM LASER/STENT PLACEMENT (Right) as a surgical intervention.  The patient's history has been reviewed, patient examined, no change in status, stable for surgery.  I have reviewed the patient's chart and labs.  Questions were answered to the patient's satisfaction.     Mamoru Takeshita C Maciah Feeback

## 2021-11-08 NOTE — Anesthesia Postprocedure Evaluation (Signed)
Anesthesia Post Note  Patient: Kristin Copeland  Procedure(s) Performed: CYSTOSCOPY/URETEROSCOPY STENT PLACEMENT (Right)  Patient location during evaluation: PACU Anesthesia Type: General Level of consciousness: awake and alert Pain management: pain level controlled Vital Signs Assessment: post-procedure vital signs reviewed and stable Respiratory status: spontaneous breathing, nonlabored ventilation, respiratory function stable and patient connected to nasal cannula oxygen Cardiovascular status: blood pressure returned to baseline and stable Postop Assessment: no apparent nausea or vomiting Anesthetic complications: no   No notable events documented.   Last Vitals:  Vitals:   11/08/21 1130 11/08/21 1151  BP: (!) 135/95 136/90  Pulse: 72 73  Resp: 17 18  Temp: 36.4 C (!) 36.2 C  SpO2: 98% 99%    Last Pain:  Vitals:   11/08/21 1151  TempSrc: Temporal  PainSc: 0-No pain                 Lenard Simmer

## 2021-11-08 NOTE — Anesthesia Preprocedure Evaluation (Signed)
Anesthesia Evaluation  Patient identified by MRN, date of birth, ID band Patient awake    Reviewed: Allergy & Precautions, H&P , NPO status , Patient's Chart, lab work & pertinent test results, reviewed documented beta blocker date and time   History of Anesthesia Complications Negative for: history of anesthetic complications  Airway Mallampati: I  TM Distance: >3 FB Neck ROM: full    Dental  (+) Dental Advidsory Given, Partial Lower, Missing   Pulmonary neg pulmonary ROS,    Pulmonary exam normal breath sounds clear to auscultation       Cardiovascular Exercise Tolerance: Good hypertension, (-) angina(-) Past MI and (-) Cardiac Stents Normal cardiovascular exam(-) dysrhythmias (-) Valvular Problems/Murmurs Rhythm:regular Rate:Normal     Neuro/Psych PSYCHIATRIC DISORDERS Depression negative neurological ROS     GI/Hepatic Neg liver ROS, hiatal hernia, GERD  ,  Endo/Other  negative endocrine ROS  Renal/GU Renal disease (kidney stones)  negative genitourinary   Musculoskeletal   Abdominal   Peds  Hematology negative hematology ROS (+)   Anesthesia Other Findings Past Medical History: No date: ADHD No date: Anemia No date: Depression No date: GERD (gastroesophageal reflux disease)     Comment:  improved with gastric bypass No date: Headache     Comment:  migraines, improved with gastric bypass. now rare No date: History of hiatal hernia No date: History of kidney stones No date: Hypertension     Comment:  improved with Gastric Bypass. hopes to DC meds soon No date: Kidney stones No date: Sleep apnea     Comment:  resolved with weight loss   Reproductive/Obstetrics negative OB ROS                             Anesthesia Physical Anesthesia Plan  ASA: 2  Anesthesia Plan: General   Post-op Pain Management:    Induction: Intravenous  PONV Risk Score and Plan: 3 and Ondansetron,  Dexamethasone, Midazolam and Treatment may vary due to age or medical condition  Airway Management Planned: LMA  Additional Equipment:   Intra-op Plan:   Post-operative Plan: Extubation in OR  Informed Consent: I have reviewed the patients History and Physical, chart, labs and discussed the procedure including the risks, benefits and alternatives for the proposed anesthesia with the patient or authorized representative who has indicated his/her understanding and acceptance.     Dental Advisory Given  Plan Discussed with: Anesthesiologist, CRNA and Surgeon  Anesthesia Plan Comments:         Anesthesia Quick Evaluation

## 2021-11-08 NOTE — Op Note (Signed)
Preoperative diagnosis:  Right nephrolithiasis  Postoperative diagnosis:  Same  Procedure: Cystoscopy Right ureteroscopy Right ureteral stent placement Right retrograde pyelogram  Surgeon: Riki Altes, MD  Anesthesia: General  Complications: None  Intraoperative findings:  Cystoscopy-bladder mucosa normal in appearance without erythema, solid or papillary lesions.  Small stone fragment noted in bladder Right ureteroscopy-no stone fragments noted to the level of the UPJ Small fragment seen just proximal to the UPJ.  Due to narrowing of the UPJ versus stone burden the ureteroscope was unable to be advanced into the renal pelvis Right retrograde pyelogram-4 mm fragment at the UPJ with stippled calcifications renal pelvis and lower calyces  EBL: Minimal  Specimens: None  Indication: MIO SCHELLINGER is a 56 y.o. female with a history of a 13 mm UPJ calculus.  She elected  SWL for definitive treatment.  On postop follow-up she had significant fragments in the region of the right renal pelvis/UPJ with flank pain.  After reviewing the management options for treatment, he elected to proceed with the above surgical procedure(s). We have discussed the potential benefits and risks of the procedure, side effects of the proposed treatment, the likelihood of the patient achieving the goals of the procedure, and any potential problems that might occur during the procedure or recuperation. Informed consent has been obtained.  Description of procedure:  The patient was taken to the operating room and general anesthesia was induced.  The patient was placed in the dorsal lithotomy position, prepped and draped in the usual sterile fashion, and preoperative antibiotics were administered. A preoperative time-out was performed.   A 21 French cystoscope was lubricated and passed per urethra.  Panendoscopy was performed with findings as described above.  A 0.038 Sensor wire was placed through the  ureteroscope into the right UO and was advanced to the renal pelvis under fluoroscopic guidance without difficulty.  A 4.5 French semirigid ureteroscope was passed per urethra.  The right UO was easily engaged in the ureteroscope was advanced to the UPJ with findings as described above.  Prior to ureteroscope removal a second sensor wire was placed to the renal pelvis.  A single channel digital flexible ureteroscope was then advanced over the working wire into the right ureter without difficulty.  Once in the mid ureter the guidewire was removed and the ureteroscope was easily advanced to the UPJ however would not advance into the UPJ as described above.  The sensor wire was replaced and advanced to the renal pelvis.  The ureteroscope would not advance over the wire.  It was elected at this point to place a ureteral stent.  The ureteroscope was removed.  The safety wire was backloaded on the cystoscope and a 57F/22 cm Contour ureteral stent was placed over the wire.  There was good curl seen in the renal pelvis under fluoroscopy and in the bladder under direct vision.  The bladder was emptied and cystoscope was removed.  After anesthetic reversal she was transported to the PACU in stable condition.  Plan: Follow-up KUB 1 week If it appears fragments are moving along the stent the option of observation versus repeat ureteroscopy after a period of stent dilation will be discussed.   Riki Altes, M.D.

## 2021-11-08 NOTE — Discharge Instructions (Addendum)
AMBULATORY SURGERY  DISCHARGE INSTRUCTIONS   The drugs that you were given will stay in your system until tomorrow so for the next 24 hours you should not:  Drive an automobile Make any legal decisions Drink any alcoholic beverage   You may resume regular meals tomorrow.  Today it is better to start with liquids and gradually work up to solid foods.  You may eat anything you prefer, but it is better to start with liquids, then soup and crackers, and gradually work up to solid foods.   Please notify your doctor immediately if you have any unusual bleeding, trouble breathing, redness and pain at the surgery site, drainage, fever, or pain not relieved by medication.     Your post-operative visit with Dr.                                       is: Date:                        Time:    Please call to schedule your post-operative visit.  Additional Instructions:   DISCHARGE INSTRUCTIONS FOR KIDNEY STONE/URETERAL STENT   MEDICATIONS:  1. Resume all your other meds from home.  2.  AZO (over-the-counter) can help with the burning/stinging when you urinate. 3.  Oxycodone is for moderate/severe pain, Rx was sent to your pharmacy. 4.  Oxybutynin is for bladder irritation.  Rx was sent to your pharmacy 5. Tamsulosin will also help bladder/stent irritation  ACTIVITY:  1. May resume regular activities in 24 hours. 2. No driving while on narcotic pain medications  3. Drink plenty of water  4. Continue to walk at home - you can still get blood clots when you are at home, so keep active, but don't over do it.  5. May return to work/school tomorrow or when you feel ready     SIGNS/SYMPTOMS TO CALL:  Common postoperative symptoms include urinary frequency, urgency, bladder spasm and blood in the urine  Please call us if you have a fever greater than 101.5, uncontrolled nausea/vomiting, uncontrolled pain, dizziness, unable to urinate, excessively bloody urine, chest pain, shortness of  breath, leg swelling, leg pain, or any other concerns or questions.   You can reach [place] at [phone].   FOLLOW-UP:  1.  Your stones were unable to be treated due to inability to advance the scope into the renal pelvis.  A stent was placed.  Will get an x-ray next week and you will be contacted with results

## 2021-11-13 ENCOUNTER — Telehealth: Payer: Self-pay | Admitting: Urology

## 2021-11-13 DIAGNOSIS — N200 Calculus of kidney: Secondary | ICD-10-CM

## 2021-11-13 NOTE — Telephone Encounter (Signed)
Please have patient get a KUB Monday or Tuesday.  Order was entered will notify with results ?

## 2021-11-14 NOTE — Telephone Encounter (Signed)
Notified patient as instructed.   

## 2021-11-15 ENCOUNTER — Ambulatory Visit
Admission: RE | Admit: 2021-11-15 | Discharge: 2021-11-15 | Disposition: A | Discharge status: Home / Self Care | Payer: BC Managed Care – PPO | Source: Ambulatory Visit | Attending: Urology | Admitting: Urology

## 2021-11-15 DIAGNOSIS — N200 Calculus of kidney: Secondary | ICD-10-CM | POA: Insufficient documentation

## 2021-11-17 ENCOUNTER — Telehealth: Payer: Self-pay | Admitting: Urology

## 2021-11-17 NOTE — Telephone Encounter (Signed)
Advised patient that DR. Stoioff will look over the results and we will call her for next steps.  ?

## 2021-11-17 NOTE — Telephone Encounter (Signed)
Patient stated that she had her xray done.  She stated that she doesn't know what the next step is and what she needs to do.  Please advise ?

## 2021-11-18 ENCOUNTER — Telehealth: Payer: Self-pay | Admitting: *Deleted

## 2021-11-18 NOTE — Telephone Encounter (Signed)
-----   Message from Abbie Sons, MD sent at 11/18/2021  3:50 PM EST ----- ?With stent placement the previously noted obstructing stone fragments are now back in the kidney.  Recommend scheduling follow-up ureteroscopy to remove any larger fragments.  I will send a scheduling sheet to [facility].  If she has any questions I am happy to give her a call. ?

## 2021-11-18 NOTE — Telephone Encounter (Signed)
Notified patient as instructed, patient pleased °

## 2021-11-19 ENCOUNTER — Other Ambulatory Visit: Payer: Self-pay | Admitting: Urology

## 2021-11-19 DIAGNOSIS — N200 Calculus of kidney: Secondary | ICD-10-CM

## 2021-11-19 NOTE — Progress Notes (Signed)
Surgical Physician Order Form [facility] [place] ? ?* Scheduling expectation : Next Available ? ?*Length of Case 45 min ? ?*Clearance needed: no ? ?*Anticoagulation Instructions: N/A ? ?*Aspirin Instructions: N/A ? ?*Post-op visit Date/Instructions:  1 week cysto stent removal ? ?*Diagnosis: Right Nephrolithiasis ? ?*Procedure: Right  Ureteroscopy w/laser lithotripsy & stent exchange (52356)ht  ? ? ?Additional orders: N/A ? ?-Admit type: OUTpatient ? ?-Anesthesia: General ? ?-VTE Prophylaxis Standing Order SCD?s    ?   ?Other:  ? ?-Standing Lab Orders Per Anesthesia   ? ?Lab other: UA&Urine Culture ? ?-Standing Test orders EKG/Chest x-ray per Anesthesia      ? ?Test other:  ? ?- Medications:  Ancef 2gm IV ? ?-Other orders:  N/A ? ? ? ?  ? ?

## 2021-11-21 ENCOUNTER — Ambulatory Visit: Admission: RE | Admit: 2021-11-21 | Payer: BC Managed Care – PPO | Source: Ambulatory Visit

## 2021-11-21 ENCOUNTER — Telehealth: Payer: Self-pay

## 2021-11-21 NOTE — Telephone Encounter (Signed)
I spoke with Kristin Copeland. We have discussed possible surgery dates and Tuesday March 28th, 2023 was agreed upon by all parties. Patient given information about surgery date, what to expect pre-operatively and post operatively.  ? ?We discussed that a Pre-Admission Testing office will be calling to set up the pre-op visit that will take place prior to surgery, and that these appointments are typically done over the phone with a Pre-Admissions RN.  ? ?Informed patient that our office will communicate any additional care to be provided after surgery. Patients questions or concerns were discussed during our call. Advised to call our office should there be any additional information, questions or concerns that arise. Patient verbalized understanding.  ? ?

## 2021-11-21 NOTE — Progress Notes (Signed)
[facility] Posting Form  ? ?Surgery Date/Time: Date: 12/06/2021 ? ?Surgeon: Dr. Irineo Axon, MD ? ?Surgery Location: Day Surgery ? ?Inpt ( No  )   Outpt (Yes)   Obs ( No  )  ? ?Diagnosis: N20.0 Right Nephrolithiasis ? ?-CPT: 52356 ? ?Surgery: Right Ureteroscopy with laser lithotripsy and stent exchange ? ?Stop Anticoagulations: N/A ? ?Cardiac/Medical/Pulmonary Clearance needed: no ? ?*Orders entered into EPIC  Date: 11/21/21  ? ?*Case booked in [place]  Date: 11/21/21 ? ?*Notified pt of Surgery: Date: 11/21/21 ? ?PRE-OP UA & CX: Yes, will obtain tomorrow 11/22/2021 ? ?*Placed into Prior Authorization Work Angela Nevin Date: 11/21/21 ? ? ?Assistant/laser/rep:No ? ? ? ? ? ? ? ? ? ? ? ? ? ? ? ?

## 2021-11-22 ENCOUNTER — Other Ambulatory Visit: Payer: BC Managed Care – PPO

## 2021-11-30 ENCOUNTER — Other Ambulatory Visit: Payer: Self-pay

## 2021-11-30 ENCOUNTER — Encounter
Admission: RE | Admit: 2021-11-30 | Discharge: 2021-11-30 | Disposition: A | Discharge status: Home / Self Care | Payer: BC Managed Care – PPO | Source: Ambulatory Visit | Attending: Urology | Admitting: Urology

## 2021-11-30 NOTE — Patient Instructions (Addendum)
Your procedure is scheduled on:12-06-21 Tuesday ?Report to the Registration Desk on the 1st floor of the Medical Mall.Then proceed to the 2nd floor Surgery Desk in the Medical Mall ?To find out your arrival time, please call [phone] between 1PM - 3PM on:12-05-21 Monday ? ?REMEMBER: ?Instructions that are not followed completely may result in serious medical risk, up to and including death; or upon the discretion of your surgeon and anesthesiologist your surgery may need to be rescheduled. ? ?Do not eat food OR drink any liquids after midnight the night before surgery.  ?No gum chewing, lozengers or hard candies. ? ?TAKE THESE MEDICATIONS THE MORNING OF SURGERY WITH A SIP OF WATER: ?-buPROPion [facility]) ?-propranolol (INDERAL) ?-topiramate (TOPAMAX) ?-pantoprazole (PROTONIX)-take one the night before and one on the morning of surgery - helps to prevent nausea after surgery.) ? ?Stop your metFORMIN (GLUCOPHAGE) 2 days prior to surgery-Last dose on 12-03-21 Saturday ? ?One week prior to surgery: ?Stop Anti-inflammatories (NSAIDS) such as meloxicam (MOBIC),  Advil, Aleve, Ibuprofen, Motrin, Naproxen, Naprosyn and Aspirin based products such as Excedrin, Goodys Powder, BC Powder.You may however, continue to take Tylenol if needed for pain up until the day of surgery. ? ?Stop ANY OVER THE COUNTER supplements/vitamins NOW (11-30-21) until after surgery (CALCIUM, Multiple Vitamin) ? ?Stop your phentermine NOW (11-30-21). You may resume after surgery  ? ?No Alcohol for 24 hours before or after surgery. ? ?No Smoking including e-cigarettes for 24 hours prior to surgery.  ?No chewable tobacco products for at least 6 hours prior to surgery.  ?No nicotine patches on the day of surgery. ? ?Do not use any "recreational" drugs for at least a week prior to your surgery.  ?Please be advised that the combination of cocaine and anesthesia may have negative outcomes, up to and including death. ?If you test positive for cocaine,  your surgery will be cancelled. ? ?On the morning of surgery brush your teeth with toothpaste and water, you may rinse your mouth with mouthwash if you wish. ?Do not swallow any toothpaste or mouthwash. Do not wear jewelry, make-up, hairpins, clips or nail polish. ? ?Do not wear lotions, powders, or perfumes.  ? ?Do not shave body from the neck down 48 hours prior to surgery just in case you cut yourself which could leave a site for infection.  ?Also, freshly shaved skin may become irritated if using the CHG soap. ? ?Contact lenses, hearing aids and dentures may not be worn into surgery. ? ?Do not bring valuables to the hospital. [facility] is not responsible for any missing/lost belongings or valuables.  ? ?Notify your doctor if there is any change in your medical condition (cold, fever, infection). ? ?Wear comfortable clothing (specific to your surgery type) to the hospital. ? ?After surgery, you can help prevent lung complications by doing breathing exercises.  ?Take deep breaths and cough every 1-2 hours. Your doctor may order a device called an Incentive Spirometer to help you take deep breaths. ?When coughing or sneezing, hold a pillow firmly against your incision with both hands. This is called ?splinting.? Doing this helps protect your incision. It also decreases belly discomfort. ? ?If you are being admitted to the hospital overnight, leave your suitcase in the car. ?After surgery it may be brought to your room. ? ?If you are being discharged the day of surgery, you will not be allowed to drive home. ?You will need a responsible adult (18 years or older) to drive you home and stay with you  that night.  ? ?If you are taking public transportation, you will need to have a responsible adult (18 years or older) with you. ?Please confirm with your physician that it is acceptable to use public transportation.  ? ?Please call the Pre-admissions Testing Dept. at [phone] if you have any questions about these  instructions. ? ?Surgery Visitation Policy: ? ?Patients undergoing a surgery or procedure may have two family members or support persons with them as long as the person is not COVID-19 positive or experiencing its symptoms.  ? ?

## 2021-12-05 MED ORDER — ORAL CARE MOUTH RINSE: 15.0000 mL | Freq: Once | OROMUCOSAL | Status: AC

## 2021-12-05 MED ORDER — CHLORHEXIDINE GLUCONATE 0.12 % MT SOLN: 15.0000 mL | Freq: Once | OROMUCOSAL | Status: AC

## 2021-12-05 MED ORDER — CEFAZOLIN SODIUM-DEXTROSE 2-4 GM/100ML-% IV SOLN
2.0000 g | INTRAVENOUS | Status: AC
  Administered 2021-12-06: 2 g via INTRAVENOUS

## 2021-12-05 MED ORDER — SODIUM CHLORIDE 0.9 % IV SOLN: INTRAVENOUS | Status: DC

## 2021-12-06 ENCOUNTER — Ambulatory Visit: Payer: BC Managed Care – PPO | Admitting: Certified Registered"

## 2021-12-06 ENCOUNTER — Ambulatory Visit
Admission: RE | Admit: 2021-12-06 | Discharge: 2021-12-06 | Disposition: A | Discharge status: Home / Self Care | Payer: BC Managed Care – PPO | Source: Ambulatory Visit | Attending: Urology | Admitting: Urology

## 2021-12-06 ENCOUNTER — Other Ambulatory Visit: Payer: Self-pay | Admitting: Urology

## 2021-12-06 ENCOUNTER — Encounter: Payer: Self-pay | Admitting: Urology

## 2021-12-06 ENCOUNTER — Other Ambulatory Visit: Payer: Self-pay

## 2021-12-06 ENCOUNTER — Ambulatory Visit: Payer: BC Managed Care – PPO

## 2021-12-06 ENCOUNTER — Ambulatory Visit: Payer: BC Managed Care – PPO | Admitting: Urology

## 2021-12-06 ENCOUNTER — Encounter
Admission: RE | Disposition: A | Discharge status: Home / Self Care | Payer: Self-pay | Source: Ambulatory Visit | Attending: Urology

## 2021-12-06 DIAGNOSIS — K219 Gastro-esophageal reflux disease without esophagitis: Secondary | ICD-10-CM | POA: Diagnosis not present

## 2021-12-06 DIAGNOSIS — R519 Headache, unspecified: Secondary | ICD-10-CM | POA: Insufficient documentation

## 2021-12-06 DIAGNOSIS — K449 Diaphragmatic hernia without obstruction or gangrene: Secondary | ICD-10-CM | POA: Diagnosis not present

## 2021-12-06 DIAGNOSIS — N23 Unspecified renal colic: Secondary | ICD-10-CM

## 2021-12-06 DIAGNOSIS — N200 Calculus of kidney: Secondary | ICD-10-CM

## 2021-12-06 HISTORY — PX: CYSTOSCOPY/URETEROSCOPY/HOLMIUM LASER/STENT PLACEMENT: SHX6546

## 2021-12-06 LAB — GLUCOSE, CAPILLARY
Glucose-Capillary: 71 mg/dL (ref 70–99)
Glucose-Capillary: 117 mg/dL — ABNORMAL HIGH (ref 70–99)

## 2021-12-06 SURGERY — CYSTOSCOPY/URETEROSCOPY/HOLMIUM LASER/STENT PLACEMENT
Anesthesia: General | Laterality: Right

## 2021-12-06 MED ORDER — ROCURONIUM BROMIDE 100 MG/10ML IV SOLN
INTRAVENOUS | Status: DC | PRN
Start: 2021-12-06 — End: 2021-12-06
  Administered 2021-12-06: 40 mg via INTRAVENOUS

## 2021-12-06 MED ORDER — FENTANYL CITRATE (PF) 100 MCG/2ML IJ SOLN
INTRAMUSCULAR | Status: AC
  Administered 2021-12-06: 50 ug via INTRAVENOUS
  Filled 2021-12-06: qty 2

## 2021-12-06 MED ORDER — OXYCODONE HCL 5 MG/5ML PO SOLN: 5.0000 mg | Freq: Once | ORAL | Status: DC | PRN

## 2021-12-06 MED ORDER — OXYCODONE HCL 5 MG PO TABS: 5.0000 mg | ORAL_TABLET | Freq: Once | ORAL | Status: DC | PRN

## 2021-12-06 MED ORDER — ONDANSETRON HCL 4 MG/2ML IJ SOLN
INTRAMUSCULAR | Status: AC
  Filled 2021-12-06: qty 2

## 2021-12-06 MED ORDER — KETAMINE HCL 10 MG/ML IJ SOLN
INTRAMUSCULAR | Status: DC | PRN
  Administered 2021-12-06: 25 mg via INTRAVENOUS

## 2021-12-06 MED ORDER — OXYCODONE-ACETAMINOPHEN 5-325 MG PO TABS: 1.0000 | ORAL_TABLET | Freq: Four times a day (QID) | ORAL | 0 refills | Status: DC | PRN

## 2021-12-06 MED ORDER — IOHEXOL 180 MG/ML  SOLN
INTRAMUSCULAR | Status: DC | PRN
  Administered 2021-12-06: 10 mL

## 2021-12-06 MED ORDER — DEXAMETHASONE SODIUM PHOSPHATE 10 MG/ML IJ SOLN
INTRAMUSCULAR | Status: AC
  Filled 2021-12-06: qty 1

## 2021-12-06 MED ORDER — EPHEDRINE 5 MG/ML INJ
INTRAVENOUS | Status: AC
  Filled 2021-12-06: qty 5

## 2021-12-06 MED ORDER — ONDANSETRON HCL 4 MG/2ML IJ SOLN
INTRAMUSCULAR | Status: DC | PRN
Start: 2021-12-06 — End: 2021-12-06
  Administered 2021-12-06: 4 mg via INTRAVENOUS

## 2021-12-06 MED ORDER — PHENYLEPHRINE 40 MCG/ML (10ML) SYRINGE FOR IV PUSH (FOR BLOOD PRESSURE SUPPORT)
PREFILLED_SYRINGE | INTRAVENOUS | Status: DC | PRN
  Administered 2021-12-06: 80 ug via INTRAVENOUS

## 2021-12-06 MED ORDER — KETAMINE HCL 50 MG/5ML IJ SOSY
PREFILLED_SYRINGE | INTRAMUSCULAR | Status: AC
  Filled 2021-12-06: qty 5

## 2021-12-06 MED ORDER — LACTATED RINGERS IV SOLN: INTRAVENOUS | Status: DC | PRN

## 2021-12-06 MED ORDER — SEVOFLURANE IN SOLN
RESPIRATORY_TRACT | Status: AC
  Filled 2021-12-06: qty 250

## 2021-12-06 MED ORDER — FENTANYL CITRATE (PF) 100 MCG/2ML IJ SOLN
INTRAMUSCULAR | Status: DC | PRN
  Administered 2021-12-06: 25 ug via INTRAVENOUS

## 2021-12-06 MED ORDER — PROMETHAZINE HCL 25 MG/ML IJ SOLN
INTRAMUSCULAR | Status: AC
  Administered 2021-12-06: 6.25 mg via INTRAVENOUS
  Filled 2021-12-06: qty 1

## 2021-12-06 MED ORDER — OXYCODONE-ACETAMINOPHEN 5-325 MG PO TABS: 1.0000 | ORAL_TABLET | Freq: Four times a day (QID) | ORAL | 0 refills | Status: AC | PRN

## 2021-12-06 MED ORDER — OXYBUTYNIN CHLORIDE 5 MG PO TABS: ORAL_TABLET | ORAL | 0 refills | Status: AC

## 2021-12-06 MED ORDER — MIDAZOLAM HCL 2 MG/2ML IJ SOLN
INTRAMUSCULAR | Status: AC
  Filled 2021-12-06: qty 2

## 2021-12-06 MED ORDER — LIDOCAINE HCL (CARDIAC) PF 100 MG/5ML IV SOSY
PREFILLED_SYRINGE | INTRAVENOUS | Status: DC | PRN
  Administered 2021-12-06: 100 mg via INTRAVENOUS

## 2021-12-06 MED ORDER — KETOROLAC TROMETHAMINE 30 MG/ML IJ SOLN
30.0000 mg | Freq: Once | INTRAMUSCULAR | Status: AC
  Administered 2021-12-06: 30 mg via INTRAVENOUS

## 2021-12-06 MED ORDER — EPHEDRINE SULFATE (PRESSORS) 50 MG/ML IJ SOLN
INTRAMUSCULAR | Status: DC | PRN
Start: 2021-12-06 — End: 2021-12-06
  Administered 2021-12-06: 5 mg via INTRAVENOUS
  Administered 2021-12-06: 10 mg via INTRAVENOUS

## 2021-12-06 MED ORDER — CEFAZOLIN SODIUM-DEXTROSE 2-4 GM/100ML-% IV SOLN
INTRAVENOUS | Status: AC
  Filled 2021-12-06: qty 100

## 2021-12-06 MED ORDER — PROPOFOL 10 MG/ML IV BOLUS
INTRAVENOUS | Status: DC | PRN
  Administered 2021-12-06: 150 mg via INTRAVENOUS

## 2021-12-06 MED ORDER — SODIUM CHLORIDE 0.9 % IR SOLN
Status: DC | PRN
  Administered 2021-12-06: 3000 mL via INTRAVESICAL

## 2021-12-06 MED ORDER — HYDROMORPHONE HCL 1 MG/ML IJ SOLN
INTRAMUSCULAR | Status: AC
  Administered 2021-12-06: 0.5 mg via INTRAVENOUS
  Filled 2021-12-06: qty 1

## 2021-12-06 MED ORDER — DEXAMETHASONE SODIUM PHOSPHATE 10 MG/ML IJ SOLN
INTRAMUSCULAR | Status: DC | PRN
  Administered 2021-12-06: 5 mg via INTRAVENOUS

## 2021-12-06 MED ORDER — MIDAZOLAM HCL 2 MG/2ML IJ SOLN
INTRAMUSCULAR | Status: DC | PRN
  Administered 2021-12-06: 2 mg via INTRAVENOUS

## 2021-12-06 MED ORDER — HYDROMORPHONE HCL 1 MG/ML IJ SOLN
0.5000 mg | INTRAMUSCULAR | Status: DC | PRN
  Administered 2021-12-06: 0.5 mg via INTRAVENOUS

## 2021-12-06 MED ORDER — SUGAMMADEX SODIUM 200 MG/2ML IV SOLN
INTRAVENOUS | Status: DC | PRN
  Administered 2021-12-06: 100 mg via INTRAVENOUS

## 2021-12-06 MED ORDER — KETOROLAC TROMETHAMINE 30 MG/ML IJ SOLN
INTRAMUSCULAR | Status: AC
  Filled 2021-12-06: qty 1

## 2021-12-06 MED ORDER — FENTANYL CITRATE (PF) 100 MCG/2ML IJ SOLN
INTRAMUSCULAR | Status: AC
  Filled 2021-12-06: qty 2

## 2021-12-06 MED ORDER — PHENYLEPHRINE 40 MCG/ML (10ML) SYRINGE FOR IV PUSH (FOR BLOOD PRESSURE SUPPORT)
PREFILLED_SYRINGE | INTRAVENOUS | Status: AC
  Filled 2021-12-06: qty 10

## 2021-12-06 MED ORDER — TAMSULOSIN HCL 0.4 MG PO CAPS: 0.4000 mg | ORAL_CAPSULE | ORAL | 0 refills | Status: AC | PRN

## 2021-12-06 MED ORDER — PROMETHAZINE HCL 25 MG/ML IJ SOLN
6.2500 mg | INTRAMUSCULAR | Status: AC | PRN
  Administered 2021-12-06: 6.25 mg via INTRAVENOUS

## 2021-12-06 MED ORDER — FENTANYL CITRATE (PF) 100 MCG/2ML IJ SOLN
25.0000 ug | INTRAMUSCULAR | Status: DC | PRN
  Administered 2021-12-06: 50 ug via INTRAVENOUS

## 2021-12-06 MED ORDER — CHLORHEXIDINE GLUCONATE 0.12 % MT SOLN
OROMUCOSAL | Status: AC
  Administered 2021-12-06: 15 mL via OROMUCOSAL
  Filled 2021-12-06: qty 15

## 2021-12-06 SURGICAL SUPPLY — 28 items
BAG DRAIN CYSTO-URO LG1000N (MISCELLANEOUS) ×2 IMPLANT
BASKET ZERO TIP 1.9FR (BASKET) IMPLANT
CATH URET FLEX-TIP 2 LUMEN 10F (CATHETERS) IMPLANT
CATH URETL OPEN END 6X70 (CATHETERS) IMPLANT
CNTNR SPEC 2.5X3XGRAD LEK (MISCELLANEOUS)
CONT SPEC 4OZ STER OR WHT (MISCELLANEOUS)
CONTAINER SPEC 2.5X3XGRAD LEK (MISCELLANEOUS) IMPLANT
DRAPE UTILITY 15X26 TOWEL STRL (DRAPES) ×2 IMPLANT
FIBER LASER MOSES 200 DFL (Laser) ×1 IMPLANT
GAUZE 4X4 16PLY ~~LOC~~+RFID DBL (SPONGE) ×3 IMPLANT
GLOVE SURG UNDER POLY LF SZ7.5 (GLOVE) ×2 IMPLANT
GOWN STRL REUS W/ TWL LRG LVL3 (GOWN DISPOSABLE) ×1 IMPLANT
GOWN STRL REUS W/ TWL XL LVL3 (GOWN DISPOSABLE) ×1 IMPLANT
GOWN STRL REUS W/TWL LRG LVL3 (GOWN DISPOSABLE) ×1
GOWN STRL REUS W/TWL XL LVL3 (GOWN DISPOSABLE) ×1
GUIDEWIRE STR DUAL SENSOR (WIRE) ×2 IMPLANT
IV NS IRRIG 3000ML ARTHROMATIC (IV SOLUTION) ×2 IMPLANT
KIT TURNOVER CYSTO (KITS) ×2 IMPLANT
PACK CYSTO AR (MISCELLANEOUS) ×2 IMPLANT
SET CYSTO W/LG BORE CLAMP LF (SET/KITS/TRAYS/PACK) ×2 IMPLANT
SHEATH URETERAL 12FRX35CM (MISCELLANEOUS) IMPLANT
STENT URET 6FRX22 CONTOUR (STENTS) ×1 IMPLANT
STENT URET 6FRX24 CONTOUR (STENTS) IMPLANT
STENT URET 6FRX26 CONTOUR (STENTS) IMPLANT
SURGILUBE 2OZ TUBE FLIPTOP (MISCELLANEOUS) ×2 IMPLANT
TRACTIP FLEXIVA PULSE ID 200 (Laser) ×3 IMPLANT
VALVE UROSEAL ADJ ENDO (VALVE) ×1 IMPLANT
WATER STERILE IRR 500ML POUR (IV SOLUTION) ×2 IMPLANT

## 2021-12-06 NOTE — Anesthesia Procedure Notes (Signed)
Procedure Name: Intubation ?Date/Time: 12/06/2021 12:19 PM ?Performed by: Cammie Sickle, CRNA ?Pre-anesthesia Checklist: Patient identified, Patient being monitored, Timeout performed, Emergency Drugs available and Suction available ?Patient Re-evaluated:Patient Re-evaluated prior to induction ?Oxygen Delivery Method: Circle system utilized ?Preoxygenation: Pre-oxygenation with 100% oxygen ?Induction Type: IV induction ?Ventilation: Mask ventilation without difficulty ?Laryngoscope Size: 3 and McGraph ?Grade View: Grade I ?Tube type: Oral ?Tube size: 7.0 mm ?Number of attempts: 1 ?Airway Equipment and Method: Stylet ?Placement Confirmation: ETT inserted through vocal cords under direct vision, positive ETCO2 and breath sounds checked- equal and bilateral ?Secured at: 21 cm ?Tube secured with: Tape ?Dental Injury: Teeth and Oropharynx as per pre-operative assessment  ? ? ? ? ?

## 2021-12-06 NOTE — Anesthesia Preprocedure Evaluation (Signed)
Anesthesia Evaluation  ?Patient identified by MRN, date of birth, ID band ?Patient awake ? ? ? ?Reviewed: ?Allergy & Precautions, NPO status , Patient's Chart, lab work & pertinent test results ? ?History of Anesthesia Complications ?Negative for: history of anesthetic complications ? ?Airway ?Mallampati: III ? ?TM Distance: <3 FB ?Neck ROM: full ? ? ? Dental ? ?(+) Chipped ?  ?Pulmonary ?neg shortness of breath, sleep apnea ,  ?  ?Pulmonary exam normal ? ? ? ? ? ? ? Cardiovascular ?Exercise Tolerance: Good ?hypertension, (-) angina(-) Past MI and (-) DOE Normal cardiovascular exam ? ? ?  ?Neuro/Psych ? Headaches, PSYCHIATRIC DISORDERS   ? GI/Hepatic ?Neg liver ROS, hiatal hernia, GERD  Controlled,  ?Endo/Other  ?negative endocrine ROS ? Renal/GU ?Renal disease  ? ?  ?Musculoskeletal ? ? Abdominal ?  ?Peds ? Hematology ?negative hematology ROS ?(+)   ?Anesthesia Other Findings ?Past Medical History: ?No date: ADHD ?No date: Anemia ?No date: Depression ?No date: GERD (gastroesophageal reflux disease) ?    Comment:  improved with gastric bypass ?No date: Headache ?    Comment:  migraines, improved with gastric bypass. now rare ?No date: History of hiatal hernia ?No date: History of kidney stones ?No date: Hypertension ?    Comment:  improved with Gastric Bypass. hopes to DC meds soon ?No date: Kidney stones ?No date: Sleep apnea ?    Comment:  resolved with weight loss ? ?Past Surgical History: ?2000: ABDOMINAL HYSTERECTOMY ?11/08/2021: CYSTOSCOPY/URETEROSCOPY/HOLMIUM LASER/STENT PLACEMENT;  ?Right ?    Comment:  Procedure: CYSTOSCOPY/URETEROSCOPY STENT PLACEMENT;   ?             Surgeon: Abbie Sons, MD;  Location: [facility] ORS;   ?             Service: Urology;  Laterality: Right; ?10/13/2021: EXTRACORPOREAL SHOCK WAVE LITHOTRIPSY; Right ?    Comment:  Procedure: EXTRACORPOREAL SHOCK WAVE LITHOTRIPSY (ESWL); ?             Surgeon: Hollice Espy, MD;  Location: [facility] ORS;   ?              Service: Urology;  Laterality: Right; ?04/04/2017: [place]; Right ?    Comment:  Procedure: [place] hallux valgus correction right foot;   ?             Surgeon: Samara Deist, DPM;  Location: [facility]  ?             [facility];  Service: Podiatry;  Laterality: Right;  Latex  ?             sensitivity ?No date: HAND SURGERY ?08/08/2016: LAPAROSCOPIC ROUX-EN-Y GASTRIC BYPASS WITH HIATAL HERNIA  ?REPAIR; N/A ?    Comment:  Procedure: LAPAROSCOPIC ROUX-EN-Y GASTRIC BYPASS WITH  ?             HIATAL HERNIA REPAIR, UPPER ENDO;  Surgeon: Greer Pickerel,  ?             MD;  Location: [facility];  Service: General;  Laterality:  ?             N/A; ? ?BMI   ? Body Mass Index: 24.22 kg/m?  ?  ? ? Reproductive/Obstetrics ?negative OB ROS ? ?  ? ? ? ? ? ? ? ? ? ? ? ? ? ?  ?  ? ? ? ? ? ? ? ? ?Anesthesia Physical ?Anesthesia Plan ? ?ASA: 3 ? ?Anesthesia Plan: General ETT  ? ?  Post-op Pain Management:   ? ?Induction: Intravenous ? ?PONV Risk Score and Plan: Ondansetron, Dexamethasone, Midazolam and Treatment may vary due to age or medical condition ? ?Airway Management Planned: Oral ETT ? ?Additional Equipment:  ? ?Intra-op Plan:  ? ?Post-operative Plan: Extubation in OR ? ?Informed Consent: I have reviewed the patients History and Physical, chart, labs and discussed the procedure including the risks, benefits and alternatives for the proposed anesthesia with the patient or authorized representative who has indicated his/her understanding and acceptance.  ? ? ? ?Dental Advisory Given ? ?Plan Discussed with: Anesthesiologist, CRNA and Surgeon ? ?Anesthesia Plan Comments: (Patient consented for risks of anesthesia including but not limited to:  ?- adverse reactions to medications ?- damage to eyes, teeth, lips or other oral mucosa ?- nerve damage due to positioning  ?- sore throat or hoarseness ?- Damage to heart, brain, nerves, lungs, other parts of body or loss of life ? ?Patient voiced understanding.)   ? ? ? ? ? ? ?Anesthesia Quick Evaluation ? ?

## 2021-12-06 NOTE — H&P (Signed)
? ?Urology H&P ? ? ?History of Present Illness: Kristin Copeland is a 56 y.o. female who underwent SWL of a 13 mm right UPJ calculus early February 2023.  She had intermittent renal colic and obstructing fragments of the UPJ.  Underwent right ureteroscopy 11/08/2021 however the ureteroscope was unable to be advanced beyond the UPJ secondary to narrowing versus stone burden.  A ureteral stent was placed and she presents today for follow-up ureteroscopy. ? ?Past Medical History:  ?Diagnosis Date  ? ADHD   ? Anemia   ? Depression   ? GERD (gastroesophageal reflux disease)   ? improved with gastric bypass  ? Headache   ? migraines, improved with gastric bypass. now rare  ? History of hiatal hernia   ? History of kidney stones   ? Hypertension   ? improved with Gastric Bypass. hopes to DC meds soon  ? Kidney stones   ? Sleep apnea   ? resolved with weight loss  ? ? ?Past Surgical History:  ?Procedure Laterality Date  ? ABDOMINAL HYSTERECTOMY  2000  ? CYSTOSCOPY/URETEROSCOPY/HOLMIUM LASER/STENT PLACEMENT Right 11/08/2021  ? Procedure: CYSTOSCOPY/URETEROSCOPY STENT PLACEMENT;  Surgeon: Riki Altes, MD;  Location: [facility];  Service: Urology;  Laterality: Right;  ? EXTRACORPOREAL SHOCK WAVE LITHOTRIPSY Right 10/13/2021  ? Procedure: EXTRACORPOREAL SHOCK WAVE LITHOTRIPSY (ESWL);  Surgeon: Vanna Scotland, MD;  Location: [facility];  Service: Urology;  Laterality: Right;  ? HALLUX VALGUS [place]USTIN Right 04/04/2017  ? Procedure: [place] hallux valgus correction right foot;  Surgeon: Gwyneth Revels, DPM;  Location: [facility];  Service: Podiatry;  Laterality: Right;  Latex sensitivity  ? HAND SURGERY    ? LAPAROSCOPIC ROUX-EN-Y GASTRIC BYPASS WITH HIATAL HERNIA REPAIR N/A 08/08/2016  ? Procedure: LAPAROSCOPIC ROUX-EN-Y GASTRIC BYPASS WITH HIATAL HERNIA REPAIR, UPPER ENDO;  Surgeon: Gaynelle Adu, MD;  Location: [facility];  Service: General;  Laterality: N/A;  ? ? ?Home Medications:  ?Current Meds  ?Medication Sig  ? acetaminophen  (TYLENOL) 500 MG tablet Take 500 mg by mouth every 6 (six) hours as needed for moderate pain.  ? buPROPion (WELLBUTRIN) 100 MG tablet Take 100 mg by mouth 3 (three) times daily.  ? meloxicam (MOBIC) 15 MG tablet Take 15 mg by mouth 2 (two) times daily as needed for pain.  ? metFORMIN (GLUCOPHAGE) 500 MG tablet Take 500 mg by mouth 2 (two) times daily.  ? methylphenidate (RITALIN) 10 MG tablet Take 10 mg by mouth 2 (two) times daily.  ? Multiple Vitamin (MULTI-VITAMINS) TABS Take 1 tablet by mouth daily.   ? ondansetron (ZOFRAN-ODT) 8 MG disintegrating tablet Take 8 mg by mouth every 8 (eight) hours as needed for nausea/vomiting.  ? oxybutynin (DITROPAN) 5 MG tablet 1 tab tid prn frequency,urgency, bladder spasm  ? pantoprazole (PROTONIX) 40 MG tablet Take 40 mg by mouth every morning.  ? phentermine 15 MG capsule Take 15 mg by mouth daily.  ? propranolol (INDERAL) 40 MG tablet Take 40 mg by mouth 2 (two) times daily.  ? tamsulosin (FLOMAX) 0.4 MG CAPS capsule Take 1 capsule (0.4 mg total) by mouth daily. (Patient taking differently: Take 0.4 mg by mouth as needed.)  ? topiramate (TOPAMAX) 25 MG tablet Take 25 mg by mouth 2 (two) times daily.  ? ? ?Allergies:  ?Allergies  ?Allergen Reactions  ? Shellfish Allergy Shortness Of Breath and Rash  ? Nsaids   ?  Avoids due to gastric bypass  ? Latex Rash  ?  Only when wearing gloves for  extended time.  ? ? ?Family History  ?Problem Relation Age of Onset  ? Diabetes Mother   ? Hypertension Mother   ? Cancer Mother   ? Cancer Sister   ? Cystic fibrosis Maternal Grandmother   ? Hypertension Maternal Grandmother   ? Kidney disease Maternal Grandmother   ? ? ?Social History:  reports that she has never smoked. She has never used smokeless tobacco. She reports current alcohol use. She reports that she does not use drugs. ? ?ROS: ?Noncontributory except as per the HPI ? ?Physical Exam:  ?Vital signs in last 24 hours: ?Temp:  [97.2 ?F (36.2 ?C)] 97.2 ?F (36.2 ?C) (03/28  1124) ?Pulse Rate:  [58] 58 (03/28 1124) ?Resp:  [16] 16 (03/28 1124) ?BP: (103)/(80) 103/80 (03/28 1124) ?SpO2:  [97 %] 97 % (03/28 1124) ?Weight:  [56.2 kg] 56.2 kg (03/28 1124) ?Constitutional:  Alert and oriented, No acute distress ?HEENT: [place] AT, moist mucus membranes.  Trachea midline, no masses ?Cardiovascular: Regular rate and rhythm, no clubbing, cyanosis, or edema. ?Respiratory: Normal respiratory effort, lungs clear bilaterally ? ?Laboratory Data:  ?No results for input(s): WBC, HGB, HCT in the last 72 hours. ?No results for input(s): NA, K, CL, CO2, GLUCOSE, BUN, CREATININE, CALCIUM in the last 72 hours. ?No results for input(s): LABPT, INR in the last 72 hours. ?No results for input(s): LABURIN in the last 72 hours. ?Results for orders placed or performed in visit on 11/02/21  ?Microscopic Examination     Status: Abnormal  ? Collection Time: 11/02/21 10:54 AM  ? Urine  ?Result Value Ref Range Status  ? WBC, UA 11-30 (A) 0 - 5 /hpf Final  ? RBC 0-2 0 - 2 /hpf Final  ? Epithelial Cells (non renal) >10 (H) 0 - 10 /hpf Final  ? Casts Present (A) None seen /lpf Final  ? Cast Type Hyaline casts N/A Final  ? Bacteria, UA Many (A) None seen/Few Final  ?CULTURE, URINE COMPREHENSIVE     Status: None  ? Collection Time: 11/02/21 11:42 AM  ? Specimen: Urine  ? UR  ?Result Value Ref Range Status  ? Urine Culture, Comprehensive Final report  Final  ? Organism ID, Bacteria Comment  Final  ?  Comment: Mixed urogenital flora ?10,000-25,000 colony forming units per mL ?  ? ? ? ?Impression/Assessment:  ?1.  Status post SWL for a 13 mm right UPJ calculus with obstructing fragments and subsequent stent placement ? ?Plan:  ?Follow-up right ureteroscopy with removal of any identified significantly sized stone fragments.  The procedure has been discussed in detail and the probability of short-term indwelling stent postoperatively.  All questions were answered and she desires to proceed ? ? ?12/06/2021, 12:05 PM  ?Irineo Axon,   MD ? ? ? ?

## 2021-12-06 NOTE — Interval H&P Note (Signed)
History and Physical Interval Note: ? ?12/06/2021 ?12:08 PM ? ?Kristin Copeland  has presented today for surgery, with the diagnosis of Right Nephrolithiasis.  The various methods of treatment have been discussed with the patient and family. After consideration of risks, benefits and other options for treatment, the patient has consented to  Procedure(s): ?CYSTOSCOPY/URETEROSCOPY/HOLMIUM LASER/STENT PLACEMENT (Right) as a surgical intervention.  The patient's history has been reviewed, patient examined, no change in status, stable for surgery.  I have reviewed the patient's chart and labs.  Questions were answered to the patient's satisfaction.   ? ? ?[name] C Ammanda Dobbins ? ? ?

## 2021-12-06 NOTE — Discharge Instructions (Addendum)
DISCHARGE INSTRUCTIONS FOR KIDNEY STONE/URETERAL STENT  ? ?MEDICATIONS:  ?1. Resume all your other meds from home.  ?2.  AZO (over-the-counter) can help with the burning/stinging when you urinate. ? ?ACTIVITY:  ?1. May resume regular activities in 24 hours. ?2. No driving while on narcotic pain medications  ?3. Drink plenty of water  ?4. Continue to walk at home - you can still get blood clots when you are at home, so keep active, but don't over do it.  ?5. May return to work/school tomorrow or when you feel ready  ? ? ?SIGNS/SYMPTOMS TO CALL:  ?Common postoperative symptoms include urinary frequency, urgency, bladder spasm and blood in the urine ? ?Please call us if you have a fever greater than 101.5, uncontrolled nausea/vomiting, uncontrolled pain, dizziness, unable to urinate, excessively bloody urine, chest pain, shortness of breath, leg swelling, leg pain, or any other concerns or questions.  ? ?You can reach [place] at [phone].  ? ?FOLLOW-UP:  ?1. You are scheduled for an appointment 12/15/2021 for stent removal. ? ?AMBULATORY SURGERY  ?DISCHARGE INSTRUCTIONS ? ? ?The drugs that you were given will stay in your system until tomorrow so for the next 24 hours you should not: ? ?Drive an automobile ?Make any legal decisions ?Drink any alcoholic beverage ? ? ?You may resume regular meals tomorrow.  Today it is better to start with liquids and gradually work up to solid foods. ? ?You may eat anything you prefer, but it is better to start with liquids, then soup and crackers, and gradually work up to solid foods. ? ? ?Please notify your doctor immediately if you have any unusual bleeding, trouble breathing, redness and pain at the surgery site, drainage, fever, or pain not relieved by medication. ? ? ? ?Additional Instructions: ? ? ? ?Please contact your physician with any problems or Same Day Surgery at [phone], Monday through Friday 6 am to 4 pm, or [place] [facility] number at [phone].  ? ?

## 2021-12-06 NOTE — OR Nursing (Signed)
[facility] [address] does not [facility] in stock.  MD notified to [facility] to [facility] [address].  States he will take care of it. ? ?

## 2021-12-06 NOTE — Transfer of Care (Signed)
Immediate Anesthesia Transfer of Care Note ? ?Patient: Kristin Copeland ? ?Procedure(s) Performed: CYSTOSCOPY/URETEROSCOPY/HOLMIUM LASER/STENT PLACEMENT (Right) ? ?Patient Location: PACU ? ?Anesthesia Type:General ? ?Level of Consciousness: drowsy ? ?Airway & Oxygen Therapy: Patient Spontanous Breathing and Patient connected to face mask oxygen ? ?Post-op Assessment: Report given to RN and Post -op Vital signs reviewed and stable ? ?Post vital signs: Reviewed and stable ? ?Last Vitals:  ?Vitals Value Taken Time  ?BP 116/83 12/06/21 1330  ?Temp 35.9   ?Pulse 72 12/06/21 1332  ?Resp 17 12/06/21 1332  ?SpO2 100 % 12/06/21 1332  ?Vitals shown include unvalidated device data. ? ?Last Pain:  ?Vitals:  ? 12/06/21 1124  ?TempSrc: Temporal  ?PainSc: 0-No pain  ?   ? ?  ? ?Complications: No notable events documented. ?

## 2021-12-06 NOTE — Op Note (Signed)
Preoperative diagnosis: Right nephrolithiasis  ? ?Postoperative diagnosis: Right nephrolithiasis ? ?Procedure: ? ?Cystoscopy ?Right ureteroscopy and stone removal ?Ureteroscopic laser lithotripsy ?Right ureteral stent exchange (42F/26 cm)  ?Right retrograde pyelography with interpretation ? ?Surgeon: Ronda Fairly. Skila Rollins, M.D. ? ?Anesthesia: General ? ?Complications: None ? ?Intraoperative findings:  ?Cystoscopy-bladder mucosa with inflammatory changes right hemitrigone secondary to indwelling stent.  No solid or papillary lesions ?Ureteropyeloscopy-no stone fragments identified in the ureter.  Multiple fragments primarily in a lower and middle pole calyx measuring 2-4 mm ?Right retrograde pyelography post procedure showed no filling defects, stone fragments or contrast extravasation ? ?EBL: Minimal ? ?Specimens: ?Calculus fragments for analysis ? ? ?Indication: Kristin Copeland is a 56 y.o. female who underwent SWL of a 13 mm right UPJ calculus early February 2023.  She had intermittent renal colic and obstructing fragments of the UPJ.  Underwent right ureteroscopy 11/08/2021 however the ureteroscope was unable to be advanced beyond the UPJ secondary to narrowing versus stone burden.  A ureteral stent was placed and she presents today for follow-up ureteroscopy.  ? ?After reviewing the management options for treatment, the patient elected to proceed with the above surgical procedure(s). We have discussed the potential benefits and risks of the procedure, side effects of the proposed treatment, the likelihood of the patient achieving the goals of the procedure, and any potential problems that might occur during the procedure or recuperation. Informed consent has been obtained. ? ?Description of procedure: ? ?The patient was taken to the operating room and general anesthesia was induced.  The patient was placed in the dorsal lithotomy position, prepped and draped in the usual sterile fashion, and preoperative antibiotics were  administered. A preoperative time-out was performed.  ? ?A 21 French cystoscope was lubricated and passed per urethra.  Cystoscopy was performed with findings as described above. ? ?Attention was directed to the right ureteral orifice and a 0.038 Sensor wire was then advanced up the ureter alongside the indwelling stent into the renal pelvis under fluoroscopic guidance.  The cystoscope was removed and then repassed.  The indwelling stent was grasped with endoscopic forceps and removed without difficulty. ? ?A single channel digital flexible ureteroscope was inserted per urethra.  The ureteroscope was easily placed into the right UO alongside the guidewire and advanced proximally into the renal pelvis with findings as described above. ? ?A 200 ?m Moses holmium laser fiber was placed through the ureteroscope and the multiple fragments were dusted at an initial setting of 0.3 J/120 Hz.  They were then further treated with noncontact laser lithotripsy at a setting of 0.6J/50 Hz.  The calyces were examined.  A 4 mm fragment was identified in a different midpole calyx.  It was placed in a 1.9 French nitinol basket and removed without difficulty and sent for analysis.  The ureteroscope was readvanced.  Retrograde pyelogram was performed through the ureteroscope with findings as described above.  All calyces were examined and no fragments identified larger than the tip of the laser fiber with the exception of a lower pole calyx where additional fragments not previously seen were identified and treated with noncontact laser lithotripsy at 0.6 J / 50 Hz.  These fragments were treated until they were less than the size of the holmium laser fiber. ? ?The ureteroscope was removed and there were no ureteral fragments noted. ? ?A 42F/26 cm Contour ureteral stent was placed under fluoroscopic guidance.  The wire was then removed with an adequate stent curl noted in the renal pelvis  as well as in the bladder. ? ?The bladder was then  emptied and the procedure ended.  The patient appeared to tolerate the procedure well and without complications.  After anesthetic reversal the patient was transported to the PACU in stable condition.  ? ?Plan: ?Scheduled for cystoscopy with stent removal in the office 12/15/2021 ? ?John Giovanni, MD ?

## 2021-12-06 NOTE — Progress Notes (Signed)
FBS of 71 reported to Dr. Amie Critchley. No new orders at this time.  ?

## 2021-12-07 ENCOUNTER — Encounter: Payer: Self-pay | Admitting: Urology

## 2021-12-07 NOTE — Anesthesia Postprocedure Evaluation (Signed)
Anesthesia Post Note ? ?Patient: ZAYNAB CHIPMAN ? ?Procedure(s) Performed: CYSTOSCOPY/URETEROSCOPY/HOLMIUM LASER/STENT PLACEMENT (Right) ? ?Patient location during evaluation: Endoscopy ?Anesthesia Type: General ?Level of consciousness: awake and alert ?Pain management: pain level controlled ?Vital Signs Assessment: post-procedure vital signs reviewed and stable ?Respiratory status: spontaneous breathing, nonlabored ventilation, respiratory function stable and patient connected to nasal cannula oxygen ?Cardiovascular status: blood pressure returned to baseline and stable ?Postop Assessment: no apparent nausea or vomiting ?Anesthetic complications: no ? ? ?No notable events documented. ? ? ?Last Vitals:  ?Vitals:  ? 12/06/21 1535 12/06/21 1644  ?BP: 124/86 127/76  ?Pulse: 70 79  ?Resp: 16   ?Temp: (!) 36.2 ?C   ?SpO2: 100% 100%  ?  ?Last Pain:  ?Vitals:  ? 12/06/21 1644  ?TempSrc:   ?PainSc: 3   ? ? ?  ?  ?  ?  ?  ?  ? ?Cleda Mccreedy Anjelica Gorniak ? ? ? ? ?

## 2021-12-08 ENCOUNTER — Encounter: Payer: BC Managed Care – PPO | Admitting: Urology

## 2021-12-12 LAB — CALCULI, WITH PHOTOGRAPH (CLINICAL LAB)
Calcium Oxalate Dihydrate: 30 %
Calcium Oxalate Monohydrate: 70 %
Weight Calculi: 22 mg

## 2021-12-15 ENCOUNTER — Encounter: Payer: Self-pay | Admitting: Urology

## 2021-12-15 ENCOUNTER — Ambulatory Visit (INDEPENDENT_AMBULATORY_CARE_PROVIDER_SITE_OTHER): Payer: BC Managed Care – PPO | Admitting: Urology

## 2021-12-15 VITALS — BP 137/90 | HR 72 | Ht 60.0 in | Wt 120.0 lb

## 2021-12-15 DIAGNOSIS — Z09 Encounter for follow-up examination after completed treatment for conditions other than malignant neoplasm: Secondary | ICD-10-CM | POA: Diagnosis not present

## 2021-12-15 DIAGNOSIS — N132 Hydronephrosis with renal and ureteral calculous obstruction: Secondary | ICD-10-CM

## 2021-12-15 DIAGNOSIS — N200 Calculus of kidney: Secondary | ICD-10-CM

## 2021-12-15 LAB — MICROSCOPIC EXAMINATION

## 2021-12-15 LAB — URINALYSIS, COMPLETE
Bilirubin, UA: NEGATIVE
Glucose, UA: NEGATIVE
Ketones, UA: NEGATIVE
Nitrite, UA: NEGATIVE
Specific Gravity, UA: 1.025 (ref 1.005–1.030)
Urobilinogen, Ur: 1 mg/dL (ref 0.2–1.0)
pH, UA: 7 (ref 5.0–7.5)

## 2021-12-15 MED ORDER — LEVOFLOXACIN 500 MG PO TABS
500.0000 mg | ORAL_TABLET | Freq: Once | ORAL | Status: AC
  Administered 2021-12-15: 500 mg via ORAL

## 2021-12-15 NOTE — Patient Instructions (Signed)
[facility] Instructions ?[facility] ?  ?You will receive a box/kit in the mail that will have a urine jug and instructions in the kit.  When the box arrives you will need to call our office [phone] to schedule a LAB appointment. ?  ?You will need to do a 24hour urine and this should be done during the days that our office will be open.  For example any day from Sunday through Thursday. ?  ?If you take Vitamin C 186m or greater please stop this 5 days prior to collection. ?  ?How to collect the urine sample: On the day you start the urine sample this 1st morning urine should NOT be collected.  For the rest of the day including all night urines should be collected.  On the next morning the 1st urine should be collected and then you will be finished with the urine collections. ?  ?You will need to bring the box with you on your LAB appointment day after urine has been collected and all instructions are complete in the box.  Your blood will be drawn and the box will be collected by our Lab employee to be sent off for analysis. ?  ?When urine and blood is complete you will need to schedule a follow up appointment for lab results.  ?

## 2021-12-16 ENCOUNTER — Encounter: Payer: Self-pay | Admitting: Urology

## 2021-12-16 NOTE — Progress Notes (Signed)
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  She underwent ureteroscopic dusting/removal of multiple fragments 12/06/2021.  She has had moderate stent symptoms and is presenting today for stent removal. ? ?Procedure:  Flexible Cystoscopy with stent removal (01655) ? ?Timeout was performed and the correct patient, procedure and participants were identified.   ? ?Description:  The patient was prepped and draped in the usual sterile fashion. Flexible cystosopy was performed.  The stent was visualized, grasped, and removed intact without difficulty. The patient tolerated the procedure well.  A single dose of oral antibiotics was given. ? ?Complications:  None ? ?Plan:  ?Stone analysis 100% calcium oxalate monohydrate ?1 month follow-up KUB and discussion of metabolic evaluation ? ?Irineo Axon, MD ? ?

## 2022-01-18 ENCOUNTER — Ambulatory Visit: Payer: BC Managed Care – PPO | Admitting: Urology

## 2022-01-20 ENCOUNTER — Encounter: Payer: Self-pay | Admitting: Urology

## 2022-01-28 ENCOUNTER — Other Ambulatory Visit: Payer: Self-pay | Admitting: Urology

## 2022-01-28 DIAGNOSIS — N200 Calculus of kidney: Secondary | ICD-10-CM

## 2022-01-28 DIAGNOSIS — N23 Unspecified renal colic: Secondary | ICD-10-CM

## 2022-03-01 ENCOUNTER — Encounter (HOSPITAL_COMMUNITY): Payer: Self-pay | Admitting: *Deleted

## 2022-12-26 IMAGING — CR DG ABDOMEN 1V
1 series · 1 of 1 positions shown · non-contrast
Comparison: One-view abdomen 11/02/2021.

CLINICAL DATA: Nephrolithiasis.

EXAM:
ABDOMEN - 1 VIEW

[dg abd 1 view]
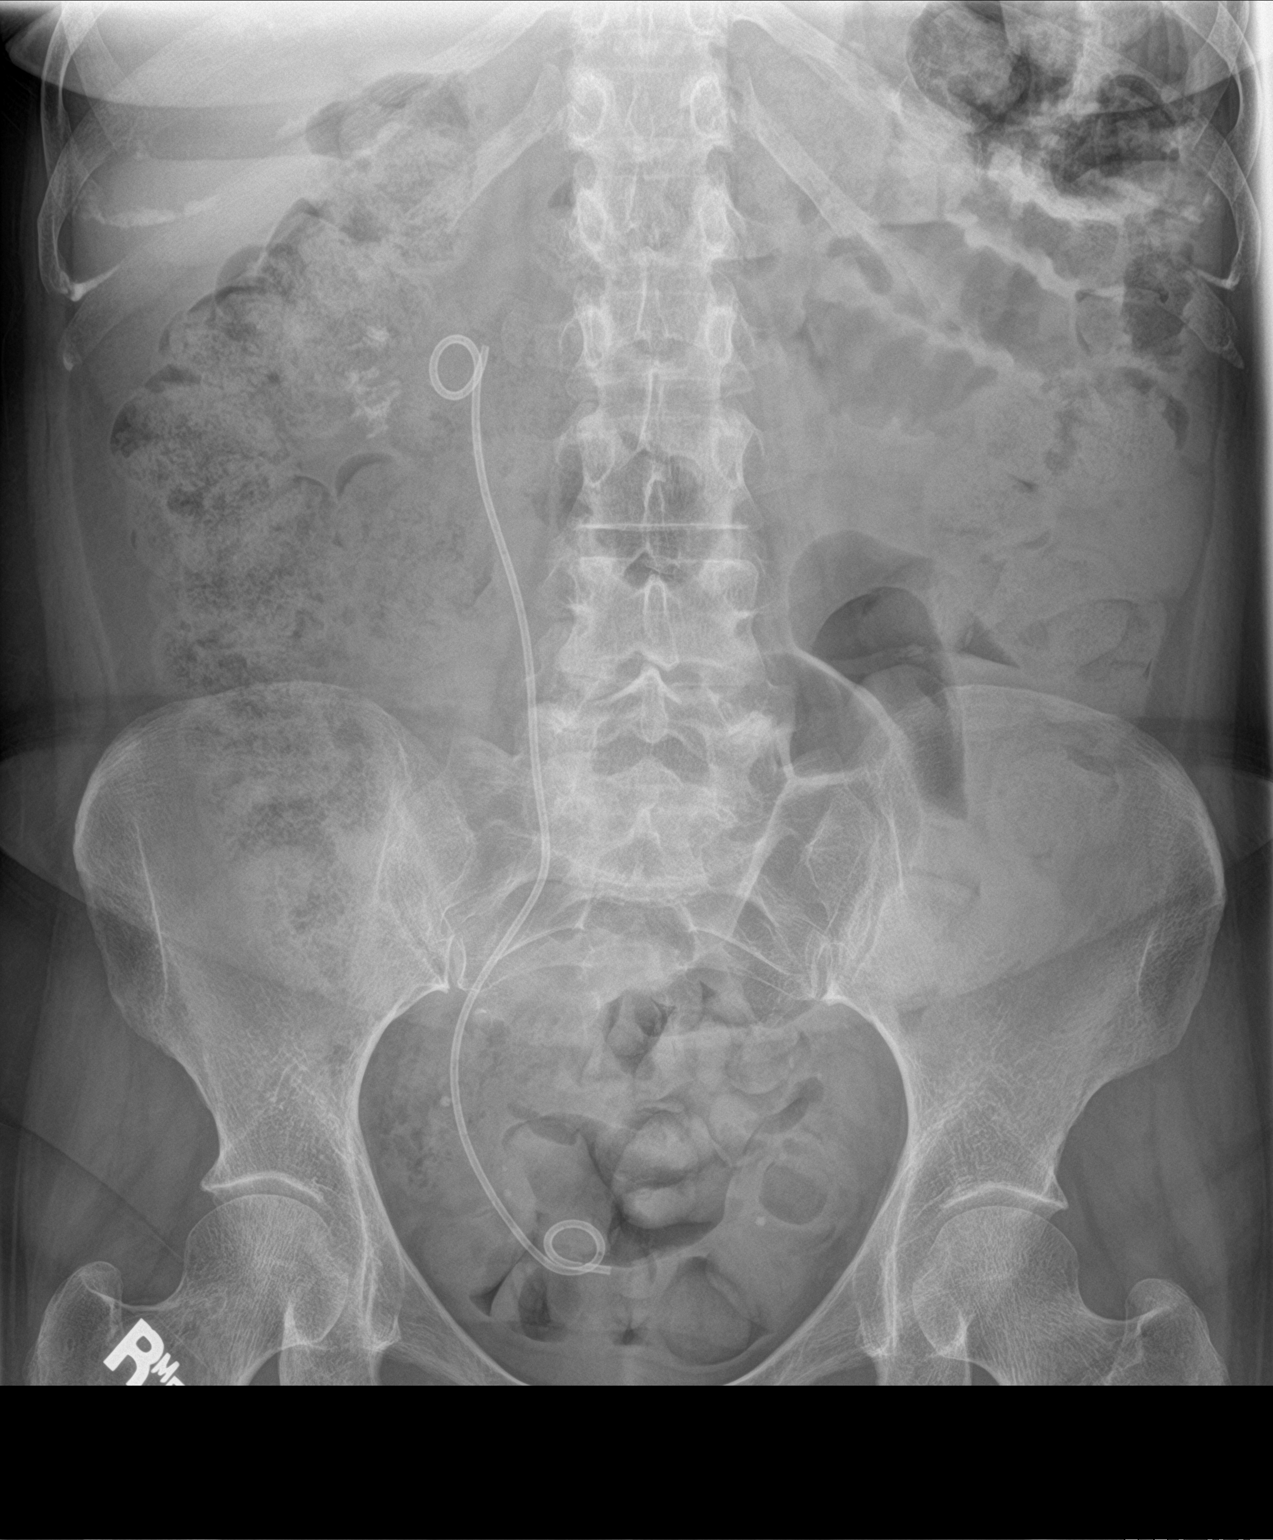

[1 of 1 positions shown; findings below may reference images not displayed]

FINDINGS: A right double-J ureteral stent is in place. Pelvic phleboliths are
stable. Stone fragments project over the right kidney. No stone
fragments project along the course of the ureter. Moderate stool is
present in the ascending colon.
IMPRESSION: 1. Right double-J ureteral stent in place.
2. Right sided stone fragments as described.
3. No ureteral stones.

## 2023-03-27 ENCOUNTER — Encounter (HOSPITAL_COMMUNITY): Payer: Self-pay | Admitting: *Deleted

## 2023-08-21 LAB — COLOGUARD: COLOGUARD: NEGATIVE
